# Patient Record
Sex: Male | Born: 1935 | Race: White | Hispanic: No | Marital: Married | Smoking: Never smoker
Health system: Southern US, Community
[De-identification: ages and names within clinical notes are randomized; demographics above are authoritative.]

## PROBLEM LIST (undated history)

## (undated) DIAGNOSIS — I480 Paroxysmal atrial fibrillation: Secondary | ICD-10-CM

## (undated) DIAGNOSIS — K219 Gastro-esophageal reflux disease without esophagitis: Secondary | ICD-10-CM

## (undated) DIAGNOSIS — E119 Type 2 diabetes mellitus without complications: Secondary | ICD-10-CM

## (undated) DIAGNOSIS — Z9989 Dependence on other enabling machines and devices: Secondary | ICD-10-CM

## (undated) DIAGNOSIS — Z8719 Personal history of other diseases of the digestive system: Secondary | ICD-10-CM

## (undated) DIAGNOSIS — H3552 Pigmentary retinal dystrophy: Secondary | ICD-10-CM

## (undated) DIAGNOSIS — R4189 Other symptoms and signs involving cognitive functions and awareness: Secondary | ICD-10-CM

## (undated) DIAGNOSIS — G4733 Obstructive sleep apnea (adult) (pediatric): Secondary | ICD-10-CM

## (undated) DIAGNOSIS — Z9981 Dependence on supplemental oxygen: Secondary | ICD-10-CM

## (undated) DIAGNOSIS — F0390 Unspecified dementia without behavioral disturbance: Secondary | ICD-10-CM

## (undated) DIAGNOSIS — J189 Pneumonia, unspecified organism: Secondary | ICD-10-CM

## (undated) HISTORY — DX: Paroxysmal atrial fibrillation: I48.0

## (undated) HISTORY — DX: Pigmentary retinal dystrophy: H35.52

## (undated) HISTORY — DX: Gastro-esophageal reflux disease without esophagitis: K21.9

## (undated) HISTORY — PX: FRACTURE SURGERY: SHX138

## (undated) HISTORY — PX: CATARACT EXTRACTION: SUR2

---

## 1999-10-17 ENCOUNTER — Encounter: Payer: Self-pay | Admitting: *Deleted

## 1999-10-17 ENCOUNTER — Encounter: Admission: RE | Admit: 1999-10-17 | Discharge: 1999-10-17 | Payer: Self-pay | Admitting: *Deleted

## 2005-03-05 ENCOUNTER — Ambulatory Visit (HOSPITAL_COMMUNITY): Admission: RE | Admit: 2005-03-05 | Discharge: 2005-03-05 | Payer: Self-pay | Admitting: Gastroenterology

## 2009-10-11 HISTORY — PX: ORIF RADIAL HEAD / NECK FRACTURE: SUR956

## 2009-10-28 ENCOUNTER — Emergency Department (HOSPITAL_COMMUNITY): Admission: EM | Admit: 2009-10-28 | Discharge: 2009-10-28 | Payer: Self-pay | Admitting: Emergency Medicine

## 2009-10-31 ENCOUNTER — Inpatient Hospital Stay (HOSPITAL_COMMUNITY): Admission: RE | Admit: 2009-10-31 | Discharge: 2009-11-06 | Payer: Self-pay | Admitting: Orthopedic Surgery

## 2009-11-03 ENCOUNTER — Ambulatory Visit: Payer: Self-pay | Admitting: Physical Medicine & Rehabilitation

## 2010-02-09 HISTORY — PX: TRANSURETHRAL RESECTION OF PROSTATE: SHX73

## 2010-02-19 ENCOUNTER — Ambulatory Visit (HOSPITAL_BASED_OUTPATIENT_CLINIC_OR_DEPARTMENT_OTHER): Admission: RE | Admit: 2010-02-19 | Discharge: 2010-02-20 | Payer: Self-pay | Admitting: Urology

## 2011-02-11 LAB — CBC
HCT: 39.2 % (ref 39.0–52.0)
Hemoglobin: 13.3 g/dL (ref 13.0–17.0)
MCHC: 34 g/dL (ref 30.0–36.0)
MCV: 91.4 fL (ref 78.0–100.0)
RDW: 12.5 % (ref 11.5–15.5)

## 2011-09-19 ENCOUNTER — Other Ambulatory Visit: Payer: Self-pay | Admitting: Dermatology

## 2012-09-17 ENCOUNTER — Other Ambulatory Visit: Payer: Self-pay | Admitting: Dermatology

## 2013-09-22 ENCOUNTER — Other Ambulatory Visit: Payer: Self-pay | Admitting: Dermatology

## 2014-08-16 ENCOUNTER — Encounter: Payer: Self-pay | Admitting: *Deleted

## 2014-08-16 ENCOUNTER — Encounter: Payer: Medicare Other | Attending: Internal Medicine | Admitting: *Deleted

## 2014-08-16 DIAGNOSIS — E119 Type 2 diabetes mellitus without complications: Secondary | ICD-10-CM | POA: Diagnosis not present

## 2014-08-16 DIAGNOSIS — Z713 Dietary counseling and surveillance: Secondary | ICD-10-CM | POA: Diagnosis not present

## 2014-08-16 NOTE — Patient Instructions (Signed)
Plan:  Aim for 3 Carb Choices per meal (45 grams) +/- 1 either way  Aim for 0-15 Carbs per snack if hungry  Include protein in moderation with your meals and snacks Consider reading food labels for Total Carbohydrate and Fat Grams of foods Consider  increasing your activity level by walking for 30 minutes daily as tolerated Consider taking medication  as directed by MD  Consider [employer] or I Can't believe it's not butter vs regular butter Consider Terrill MohrSara Lee or Ashby DawesNature Own reduced calorie bread (white or wheat) Dannon Light & Fit Greek Yogurt Water Flavors   Always have protein along with carbohydrates

## 2014-08-17 NOTE — Progress Notes (Signed)
Diabetes Self-Management Education  Visit Type:  DSME  Appt. Start Time: 1400 Appt. End Time: 1530  08/17/2014  [name], identified by name and date of birth, is a 80 y.o. male with a diagnosis of Diabetes: Type 2.  Other people present during visit:  Spouse/SO   ASSESSMENT  Initial Visit Information:  Are you currently following a meal plan?: No   Are you taking your medications as prescribed?: Yes Are you checking your feet?: No (goes to salon for pedicure)   How often do you need to have someone help you when you read instructions, pamphlets, or other written materials from your doctor or pharmacy?: 5 - Always (blind)    Psychosocial:   Patient Belief/Attitude about Diabetes: Motivated to manage diabetes Self-care barriers: Impaired vision Self-management support: Doctor's office;CDE visits;Family Other persons present: Spouse/SO Patient Concerns: Weight Control;Nutrition/Meal planning Special Needs: Instruct caregiver Preferred Learning Style: Auditory;Hands on Learning Readiness: Change in progress  Complications:   Last HgB A1C per patient/outside source: 7.9 mg/dL How often do you check your blood sugar?: 1-2 times/day (FBS daily) Fasting Blood glucose range (mg/dL): [phone] Have you had a dilated eye exam in the past 12 months?:  (blind) Have you had a dental exam in the past 12 months?: Yes  Diet Intake:  Breakfast: 2 boiled egg whites, bowl cheerios 1 1/2 C , banana, Stevia, whole wheat toast Lunch: [place] sandwich, cheese, mayonaise sandwich wheat break, vegetable soup  tuna, mayo on bread Dinner: pork barbeque, bun Beverage(s): regular CheerWine, sweet Tea (Stevia), coffee,   Exercise:  Exercise: ADL's  Individualized Plan for Diabetes Self-Management Training:   Learning Objective:  Patient will have a greater understanding of diabetes self-management.  Patient education plan per assessed needs and concerns is to attend individual sessions  for Diabetes Self Management Education     Education Topics Reviewed with Patient Today:    Role of diet in the treatment of diabetes and the relationship between the three main macronutrients and blood glucose level;Food label reading, portion sizes and measuring food.;Carbohydrate counting;Information on hints to eating out and maintain blood glucose control.;Meal options for control of blood glucose level and chronic complications. Role of exercise on diabetes management, blood pressure control and cardiac health.   Yearly dilated eye exam;Daily foot exams;Identified appropriate SMBG and/or A1C goals.   Relationship between chronic complications and blood glucose control;Dental care;Assessed and discussed foot care and prevention of foot problems;Identified and discussed with patient  current chronic complications;Reviewed with patient heart disease, higher risk of, and prevention     Helped patient develop diabetes management plan for (enter comment)  PATIENTS GOALS/Plan (Developed by the patient):  Nutrition: Follow meal plan discussed Physical Activity: Exercise 5-7 days per week Medications: take my medication as prescribed Monitoring : Other (comment) (FBS daily) Reducing Risk: do foot checks daily  Plan:   Patient Instructions  Plan:  Aim for 3 Carb Choices per meal (45 grams) +/- 1 either way  Aim for 0-15 Carbs per snack if hungry  Include protein in moderation with your meals and snacks Consider reading food labels for Total Carbohydrate and Fat Grams of foods Consider  increasing your activity level by walking for 30 minutes daily as tolerated Consider taking medication  as directed by MD  Consider [employer]sey or I Can't believe it's not butter vs regular butter Consider Terrill Mohr or Ashby Dawes Own reduced calorie bread (white or wheat) Dannon Light & Fit Greek Yogurt Water Flavors   Always have  protein along with carbohydrates       Expected Outcomes:   Demonstrated interest in learning. Expect positive outcomes  Education material provided: Living Well with Diabetes, A1C conversion sheet, Meal plan card and Snack sheet  If problems or questions, patient to contact team via:  Phone  Future DSME appointment: 4-6 wks

## 2014-10-04 ENCOUNTER — Ambulatory Visit: Payer: PRIVATE HEALTH INSURANCE | Admitting: *Deleted

## 2014-11-24 ENCOUNTER — Ambulatory Visit
Admission: RE | Admit: 2014-11-24 | Discharge: 2014-11-24 | Disposition: A | Discharge status: Home / Self Care | Payer: Medicare Other | Source: Ambulatory Visit | Attending: Internal Medicine | Admitting: Internal Medicine

## 2014-11-24 ENCOUNTER — Other Ambulatory Visit: Payer: Self-pay | Admitting: Internal Medicine

## 2014-11-24 DIAGNOSIS — G4734 Idiopathic sleep related nonobstructive alveolar hypoventilation: Secondary | ICD-10-CM

## 2014-12-22 ENCOUNTER — Encounter: Payer: Self-pay | Admitting: *Deleted

## 2014-12-23 ENCOUNTER — Encounter: Payer: Self-pay | Admitting: Neurology

## 2014-12-23 ENCOUNTER — Ambulatory Visit (INDEPENDENT_AMBULATORY_CARE_PROVIDER_SITE_OTHER): Payer: Medicare Other | Admitting: Neurology

## 2014-12-23 VITALS — BP 114/73 | HR 54 | Resp 18 | Ht 68.0 in | Wt 211.0 lb

## 2014-12-23 DIAGNOSIS — Z9981 Dependence on supplemental oxygen: Secondary | ICD-10-CM

## 2014-12-23 DIAGNOSIS — R0902 Hypoxemia: Secondary | ICD-10-CM | POA: Insufficient documentation

## 2014-12-23 DIAGNOSIS — R0683 Snoring: Secondary | ICD-10-CM | POA: Diagnosis not present

## 2014-12-23 DIAGNOSIS — H3552 Pigmentary retinal dystrophy: Secondary | ICD-10-CM | POA: Insufficient documentation

## 2014-12-23 DIAGNOSIS — I480 Paroxysmal atrial fibrillation: Secondary | ICD-10-CM

## 2014-12-23 NOTE — Progress Notes (Signed)
[facility]   Provider:  Melvyn Novas, M D  Referring Provider: Yates Decamp, MD   Primary Care Physician:  Lillia Mountain, MD  Chief Complaint  Patient presents with  . NP Ganji    Rm 10, wife    HPI:  Richard Lawson is a 81 y.o. male seen here as a referral  from Dr. Yates Decamp , cardiologist.  Richard Lawson is a new patient to my practice, referred here today by his cardiologist. His primary care physician is Dr. Kirby Funk. He is here for a concern of nocturnal hypoxemia. Richard Lawson wife is giving me a detailed history she explains that last May Richard Lawson begun taking metformin for the control of hyperglycemia. After tolerating the entry dose very well metformin dose was doubled. This occurred in September 2015. A visits to his primary care physician led to the discovery of atrial fibrillation 10 days after the dose was doubled. He was the same day referred to Dr. Jacinto Halim  now his cardiologist. Dr. Jacinto Halim found the atrial fibrillation to be paroxysmal occurring and spells. And also explained that the patient is as a yearly risk of stroke of 3.2%. He followed the patient is a nocturnal pulse oximetry on 09-26-14, which revealed the oxygen level to be less than 88% for 275 minutes of the recording. The lowest SPO2 was 71%. This was a markedly abnormal test and the patient was supplied with an oxygen concentrator. Dr. Reece Agar. ordered as pulse oximetry test after the patient had started oxygen supplementation. I do not have the results of the second finger Test available here but Dr.G wrote that the patient would benefit from further sleep evaluation. This is the reason why he is coming here today he also has a mild tricuspid regurgitation and mild mitral valve regurgitation. An echo was performed on 10-13-14 his ejection fraction was 56%. Dr. Reece Agar described a left anterior fascicular block and a poor R-wave progression indicative of  possible pulmonary disease underlying.   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  GERD with Barrett's esophagus, the patient had undergone multiple balloon stretching's with Dr. Melchor Amour, shoulder surgery. Richard Lawson explained that her husband was not promptly released after hisshoulder surgery from the hospital,  due to hypoxemia and apparently apneic events in the wake up room.   Sleep habits:  PM is bedtime,  He listened to TV or Radio for several hours , falls asleep and wakes up- his wife described a very fragmented sleep pattern, as a blind person is very tuned to audio stimuli, He wakes up at 8 AM , he gets 12 hours of bedtime, unsure how much sleep. His wife rises at 6 AM,  He drinks one cup of coffee, now decaffeinated. He naps on and off. He goes to gymnasium daily and walks a mile.   He has no known family history of  Sleep disordered breathing.    Review of Systems: Out of a complete 14 system review, the patient complains of only the following symptoms, and all other reviewed systems are negative. Complete loss of eye -sight at age 74 .   Epworth score 15 , Fatigue severity score ???  , depression score???   History   Social History  . Marital Status: Married    Spouse Name: N/A  . Number of Children: 1  . Years of Education: coll  Occupational History  . retired    Social History Main Topics  . Smoking status: Never Smoker   . Smokeless tobacco: Not on file  . Alcohol Use: No  . Drug Use: No  . Sexual Activity: Not on file   Other Topics Concern  . Not on file   Social History Narrative   Caffeine 2 glasses daily avg.    Family History  Problem Relation Age of Onset  . Lung cancer Father   . COPD Brother     Past Medical History  Diagnosis Date  . Diabetes mellitus without complication   . Retinitis pigmentosa of both eyes   . GERD (gastroesophageal reflux disease)   . Paroxysmal a-fib      Past Surgical History  Procedure Laterality Date  . Shoulder surgery Right 2010    Current Outpatient Prescriptions  Medication Sig Dispense Refill  . edoxaban (SAVAYSA) 60 MG TABS tablet Take 60 mg by mouth daily.    . metoprolol succinate (TOPROL-XL) 25 MG 24 hr tablet Take 25 mg by mouth daily.    Marland Kitchen. omeprazole (PRILOSEC) 20 MG capsule Take 20 mg by mouth daily.     No current facility-administered medications for this visit.    Allergies as of 12/23/2014  . (No Known Allergies)    Vitals: BP 114/73 mmHg  Pulse 54  Resp 18  Ht 5\' 8"  (1.727 m)  Wt 211 lb (95.709 kg)  BMI 32.09 kg/m2 Last Weight:  Wt Readings from Last 1 Encounters:  12/23/14 211 lb (95.709 kg)       Last Height:   Ht Readings from Last 1 Encounters:  12/23/14 5\' 8"  (1.727 m)    Physical exam:  General: The patient is awake, alert and appears not in acute distress. The patient is well groomed. Head: Normocephalic, atraumatic. Neck is supple. Mallampati 4  neck circumference:  16 inches . Nasal airflow unrestricted  TMJ is not evident . Retrognathia is not *seen.  Cardiovascular:  irregular rate and rhythm, without murmurs or carotid bruit, and without distended neck veins. Respiratory: Lungs are clear to auscultation. Skin:  Without evidence of edema, or rash Trunk: BMI is elevated and patient  has normal posture.  Neurologic exam : The patient is awake and alert, oriented to place and time.   Memory subjective  described as intact. There is a normal attention span & concentration ability. Speech is fluent without  dysarthria, dysphonia or aphasia. Mood and affect are appropriate.  Cranial nerves: Pupils are equal and briskly reactive to light. Retinitis pigmentosa -   Hearing to finger rub intact.  Facial sensation intact to fine touch.  Facial motor strength is symmetric and tongue and uvula move midline.  Motor exam:  Normal tone, muscle bulk and symmetric strength in all  extremities.  Sensory:  Fine touch, pinprick and vibration were tested in all extremities.  Proprioception is  normal.  Coordination: Rapid alternating movements in the fingers/hands is normal. Finger-to-nose maneuver  normal without evidence of ataxia, dysmetria or tremor.  Gait and station: Patient walks without assistive device and is able unassisted to climb up to the exam table. Strength within normal limits. Stance is stable and normal.  Tandem gait is unfragmented. Romberg testing is negative.  Deep tendon reflexes: in the  upper and lower extremities are symmetric and intact. Babinski maneuver response is downgoing.   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  Tachy brady rhythms had been noticed in the post surgical wake up room.  He will need special attention in the sleep lab, as he is blind. I explained the nature of OSA, and the sleep test in detail.  I would like for his wife to stay if she can, for his comfort - not for his need. She reports him to snore when on his back.    The patient was advised of the nature of the diagnosed sleep disorder, the treatment options and risks for general a health and wellness arising from not treating the condition. Visit duration was 40 minutes. More than 50% of this visit were spent on face to face explanation and information, risk factor explanation for OSA and atrial fibrillation.   Plan:  Treatment plan and additional workup :  SPLIT night polysomnography, please be gentle and explain every step that affects or touches him. Communicate in person, not through the loudspeaker. He is very audio sensitive.      Porfirio Mylar Penny Frisbie MD  12/23/2014

## 2014-12-23 NOTE — Patient Instructions (Signed)
Hypoxemia Hypoxemia occurs when your blood does not contain enough oxygen. The body cannot work well when it does not have enough oxygen because every part of your body needs oxygen. Oxygen travels to all parts of the body through your blood. Hypoxemia can develop suddenly or can come on slowly. CAUSES Some common causes of hypoxemia include:  Long-term (chronic) lung diseases, such as chronic obstructive pulmonary disease (COPD) or interstitial lung disease.  Disorders that affect breathing at night, such as sleep apnea.  Fluid buildup in your lungs (pulmonary edema).  Lung infection (pneumonia).  Lung or throat cancer.  Abnormal blood flow that bypasses the lungs (shunt).  Certain diseasesthat affect nerves or muscles.  A collapsed lung (pneumothorax).  A blood clot in the lungs (pulmonary embolus).  Certain types of heart disease.  Slow or shallow breathing (hypoventilation).  Certain medicines.  High altitudes.  Toxic chemicals and gases. SIGNS AND SYMPTOMS Not everyone who has hypoxemia will develop symptoms. If the hypoxemia developed quickly, you will likely have symptoms such as shortness of breath. If the hypoxemia came on slowly over months or years, you may not notice any symptoms. Symptoms can include:  Shortness of breath (dyspnea).  Bluish color of the skin, lips, or nail beds.  Breathing that is fast, noisy, or shallow.  A fast heartbeat.  Feeling tired or sleepy.  Being confused or feeling anxious. DIAGNOSIS To determine if you have hypoxemia, your health care provider may perform:  A physical exam.  Blood tests.  A pulse oximetry. A sensor will be put on your finger, toe, or earlobe to measure the percent of oxygen in your blood. TREATMENT You will likely be treated with oxygen therapy. Depending on the cause of your hypoxemia, you may need oxygen for a short time (weeks or months), or you may need it indefinitely. Your health care provider  may also recommend other therapies to treat the underlying cause of your hypoxemia. HOME CARE INSTRUCTIONS  Only take over-the-counter or prescription medicines as directed by your health care provider.  Follow oxygen safety measures if you are on oxygen therapy. These may include:  Always having a backup supply of oxygen.  Not allowing anyone to smoke around oxygen.  Handling the oxygen tanks carefully and as instructed.  If you smoke, quit. Stay away from people who smoke.  Follow up with your health care provider as directed. SEEK MEDICAL CARE IF:  You have any concerns about your oxygen therapy.  You still have trouble breathing.  You become short of breath when you exercise.  You are tired when you wake up.  You have a headache when you wake up. SEEK IMMEDIATE MEDICAL CARE IF:   Your breathing gets worse.  You have new shortness of breath with normal activity.  You have a bluish color of the skin, lips, or nail beds.  You have confusion or cloudy thinking.  You cough up dark mucus.  You have chest pain.  You have a fever. MAKE SURE YOU:  Understand these instructions.  Will watch your condition.  Will get help right away if you are not doing well or get worse. Document Released: 05/13/2011 Document Revised: 11/02/2013 Document Reviewed: 05/27/2013 Exit[employer] Patient Information 2015 [employer]. This information is not intended to replace advice given to you by your health care provider. Make sure you discuss any questions you have with your health care provider.  

## 2014-12-26 ENCOUNTER — Institutional Professional Consult (permissible substitution): Payer: Self-pay | Admitting: Neurology

## 2014-12-30 ENCOUNTER — Other Ambulatory Visit: Payer: Self-pay | Admitting: Dermatology

## 2015-01-10 DIAGNOSIS — J189 Pneumonia, unspecified organism: Secondary | ICD-10-CM

## 2015-01-10 HISTORY — DX: Pneumonia, unspecified organism: J18.9

## 2015-01-17 ENCOUNTER — Other Ambulatory Visit: Payer: Self-pay | Admitting: Dermatology

## 2015-01-20 ENCOUNTER — Inpatient Hospital Stay (HOSPITAL_BASED_OUTPATIENT_CLINIC_OR_DEPARTMENT_OTHER)
Admission: EM | Admit: 2015-01-20 | Discharge: 2015-01-24 | DRG: 871 | Disposition: A | Discharge status: Home / Self Care | Payer: Medicare Other | Attending: Internal Medicine | Admitting: Internal Medicine

## 2015-01-20 ENCOUNTER — Emergency Department (HOSPITAL_BASED_OUTPATIENT_CLINIC_OR_DEPARTMENT_OTHER): Payer: Medicare Other

## 2015-01-20 ENCOUNTER — Other Ambulatory Visit (HOSPITAL_BASED_OUTPATIENT_CLINIC_OR_DEPARTMENT_OTHER): Payer: Self-pay

## 2015-01-20 ENCOUNTER — Encounter (HOSPITAL_BASED_OUTPATIENT_CLINIC_OR_DEPARTMENT_OTHER): Payer: Self-pay | Admitting: *Deleted

## 2015-01-20 DIAGNOSIS — I4510 Unspecified right bundle-branch block: Secondary | ICD-10-CM | POA: Diagnosis present

## 2015-01-20 DIAGNOSIS — R739 Hyperglycemia, unspecified: Secondary | ICD-10-CM | POA: Diagnosis present

## 2015-01-20 DIAGNOSIS — Z9981 Dependence on supplemental oxygen: Secondary | ICD-10-CM | POA: Diagnosis not present

## 2015-01-20 DIAGNOSIS — R0602 Shortness of breath: Secondary | ICD-10-CM

## 2015-01-20 DIAGNOSIS — H540 Blindness, both eyes: Secondary | ICD-10-CM | POA: Diagnosis present

## 2015-01-20 DIAGNOSIS — R509 Fever, unspecified: Secondary | ICD-10-CM | POA: Diagnosis not present

## 2015-01-20 DIAGNOSIS — I480 Paroxysmal atrial fibrillation: Secondary | ICD-10-CM | POA: Diagnosis present

## 2015-01-20 DIAGNOSIS — E871 Hypo-osmolality and hyponatremia: Secondary | ICD-10-CM | POA: Diagnosis present

## 2015-01-20 DIAGNOSIS — J189 Pneumonia, unspecified organism: Secondary | ICD-10-CM | POA: Diagnosis present

## 2015-01-20 DIAGNOSIS — E119 Type 2 diabetes mellitus without complications: Secondary | ICD-10-CM | POA: Diagnosis present

## 2015-01-20 DIAGNOSIS — A419 Sepsis, unspecified organism: Principal | ICD-10-CM | POA: Diagnosis present

## 2015-01-20 DIAGNOSIS — H3552 Pigmentary retinal dystrophy: Secondary | ICD-10-CM | POA: Diagnosis present

## 2015-01-20 DIAGNOSIS — K219 Gastro-esophageal reflux disease without esophagitis: Secondary | ICD-10-CM | POA: Diagnosis present

## 2015-01-20 DIAGNOSIS — R05 Cough: Secondary | ICD-10-CM

## 2015-01-20 DIAGNOSIS — E876 Hypokalemia: Secondary | ICD-10-CM | POA: Diagnosis present

## 2015-01-20 DIAGNOSIS — R059 Cough, unspecified: Secondary | ICD-10-CM

## 2015-01-20 LAB — URINE MICROSCOPIC-ADD ON

## 2015-01-20 LAB — BASIC METABOLIC PANEL
Anion gap: 8 (ref 5–15)
BUN: 14 mg/dL (ref 6–23)
CO2: 27 mmol/L (ref 19–32)
CREATININE: 0.84 mg/dL (ref 0.50–1.35)
Calcium: 8.8 mg/dL (ref 8.4–10.5)
Chloride: 98 mmol/L (ref 96–112)
GFR calc Af Amer: >90 mL/min (ref >90)
GFR, EST NON AFRICAN AMERICAN: 81 mL/min — AB (ref >90)
GLUCOSE: 211 mg/dL — AB (ref 70–99)
POTASSIUM: 3.8 mmol/L (ref 3.5–5.1)
Sodium: 133 mmol/L — ABNORMAL LOW (ref 135–145)

## 2015-01-20 LAB — URINALYSIS, ROUTINE W REFLEX MICROSCOPIC
Bilirubin Urine: NEGATIVE
GLUCOSE, UA: 250 mg/dL — AB
Hgb urine dipstick: NEGATIVE
Ketones, ur: NEGATIVE mg/dL
Nitrite: NEGATIVE
PROTEIN: 30 mg/dL — AB
SPECIFIC GRAVITY, URINE: 1.022 (ref 1.005–1.030)
Urobilinogen, UA: 1 mg/dL (ref 0.0–1.0)
pH: 5 (ref 5.0–8.0)

## 2015-01-20 LAB — CBC WITH DIFFERENTIAL/PLATELET
BASOS ABS: 0.1 10*3/uL (ref 0.0–0.1)
Basophils Relative: 1 % (ref 0–1)
Eosinophils Absolute: 0.2 10*3/uL (ref 0.0–0.7)
Eosinophils Relative: 1 % (ref 0–5)
HCT: 40.1 % (ref 39.0–52.0)
Hemoglobin: 13.2 g/dL (ref 13.0–17.0)
LYMPHS PCT: 13 % (ref 12–46)
Lymphs Abs: 1.8 10*3/uL (ref 0.7–4.0)
MCH: 29.9 pg (ref 26.0–34.0)
MCHC: 32.9 g/dL (ref 30.0–36.0)
MCV: 90.9 fL (ref 78.0–100.0)
Monocytes Absolute: 2.6 10*3/uL — ABNORMAL HIGH (ref 0.1–1.0)
Monocytes Relative: 18 % — ABNORMAL HIGH (ref 3–12)
NEUTROS ABS: 10 10*3/uL — AB (ref 1.7–7.7)
Neutrophils Relative %: 67 % (ref 43–77)
PLATELETS: 314 10*3/uL (ref 150–400)
RBC: 4.41 MIL/uL (ref 4.22–5.81)
RDW: 12.8 % (ref 11.5–15.5)
WBC: 14.6 10*3/uL — ABNORMAL HIGH (ref 4.0–10.5)

## 2015-01-20 MED ORDER — DEXTROSE 5 % IV SOLN
1.0000 g | Freq: Once | INTRAVENOUS | Status: AC
  Administered 2015-01-20: 1 g via INTRAVENOUS

## 2015-01-20 MED ORDER — INSULIN ASPART 100 UNIT/ML ~~LOC~~ SOLN
0.0000 [IU] | SUBCUTANEOUS | Status: DC
  Administered 2015-01-21: 2 [IU] via SUBCUTANEOUS
  Administered 2015-01-21: 1 [IU] via SUBCUTANEOUS
  Administered 2015-01-21 – 2015-01-22 (×5): 2 [IU] via SUBCUTANEOUS
  Administered 2015-01-22: 1 [IU] via SUBCUTANEOUS
  Administered 2015-01-22 (×2): 2 [IU] via SUBCUTANEOUS
  Administered 2015-01-22: 1 [IU] via SUBCUTANEOUS
  Administered 2015-01-23 (×4): 2 [IU] via SUBCUTANEOUS

## 2015-01-20 MED ORDER — CEFTRIAXONE SODIUM 1 G IJ SOLR
INTRAMUSCULAR | Status: AC
  Filled 2015-01-20: qty 10

## 2015-01-20 MED ORDER — DEXTROSE 5 % IV SOLN
500.0000 mg | Freq: Once | INTRAVENOUS | Status: AC
  Administered 2015-01-20: 500 mg via INTRAVENOUS
  Filled 2015-01-20: qty 500

## 2015-01-20 NOTE — ED Notes (Signed)
Productive cough of yellow sputum and nose for one week. Pt states just not feeling well for one week.

## 2015-01-20 NOTE — Progress Notes (Signed)
Called from [facility] to admit patient with 8 days productive cough, fever 100.5, SOB. CXR negative. [facility] presumed CAP.

## 2015-01-20 NOTE — H&P (Signed)
PCP: Lillia MountainGRIFFIN,JOHN JOSEPH, MD  [facility] [place]  Chief Complaint:  Fever   HPI: Richard Lawson is a 81 y.o. male   has a past medical history of Diabetes mellitus without complication; Retinitis pigmentosa of both eyes; GERD (gastroesophageal reflux disease); and Paroxysmal a-fib.   Presented with  1 week history of productive cough or DC yellow sputum and feeling badly. Wife states he has been waking up short of breath at night. Today he had not had a fever at home. Family brought him [facility] center [facility]. Chest x-ray was unremarkable but patient was noted to be febrile up to 100.5 meeting sepsis criteria with heart rate of 101 respirations 23 white blood cell count 14.6. Patient was accepted to [facility]. I'll in emergency department he was started on ceftriaxone and azithromycin. Patient has history of atrial fibrillation for which she is on edoxaban. Has history of diabetes apparently diet-controlled. Wife noted worsening confusion.  Patient in the process of being work up for sleep apnea.  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  Hospitalist was called for admission for SIRS vs sepsis for presumed pulmonary infection possibly lagging chest x-ray  Review of Systems:    Pertinent positives include: Fevers, chills, fatigue  Constitutional:  No weight loss, night sweats,  weight loss  HEENT:  No headaches, Difficulty swallowing,Tooth/dental problems,Sore throat,  No sneezing, itching, ear ache, nasal congestion, post nasal drip,  Cardio-vascular:  No chest pain, Orthopnea, PND, anasarca, dizziness, palpitations.no Bilateral lower extremity swelling  GI:  No heartburn, indigestion, abdominal pain, nausea, vomiting, diarrhea, change in bowel habits, loss of appetite, melena, blood in stool, hematemesis Resp:  no shortness of breath at rest. No dyspnea on exertion, No excess mucus, no productive cough, No non-productive cough, No  coughing up of blood.No change in color of mucus.No wheezing. Skin:  no rash or lesions. No jaundice GU:  no dysuria, change in color of urine, no urgency or frequency. No straining to urinate.  No flank pain.  Musculoskeletal:  No joint pain or no joint swelling. No decreased range of motion. No back pain.  Psych:  No change in mood or affect. No depression or anxiety. No memory loss.  Neuro: no localizing neurological complaints, no tingling, no weakness, no double vision, no gait abnormality, no slurred speech, no confusion  Otherwise ROS are negative except for above, 10 systems were reviewed  Past Medical History: Past Medical History  Diagnosis Date  . Diabetes mellitus without complication   . Retinitis pigmentosa of both eyes   . GERD (gastroesophageal reflux disease)   . Paroxysmal a-fib    Past Surgical History  Procedure Laterality Date  . Shoulder surgery Right 2010     Medications: Prior to Admission medications   Medication Sig Start Date End Date Taking? Authorizing Provider  edoxaban (SAVAYSA) 60 MG TABS tablet Take 60 mg by mouth daily.   Yes Historical Provider, MD  metoprolol tartrate (LOPRESSOR) 25 MG tablet Take 25 mg by mouth 2 (two) times daily.  01/17/15  Yes Historical Provider, MD  omeprazole (PRILOSEC) 20 MG capsule Take 20 mg by mouth daily.   Yes Historical Provider, MD    Allergies:  No Known Allergies  Social History:  Ambulatory   independently  Or cane,   Lives at home With family     reports that he has never smoked. He does not have any smokeless tobacco history on file. He reports that  he does not drink alcohol or use illicit drugs.    Family History: family history includes COPD in his brother; Lung cancer in his father.    Physical Exam: Patient Vitals for the past 24 hrs:  BP Temp Temp src Pulse Resp SpO2 Weight  01/20/15 2002 (!) 144/79 mmHg 99.5 F (37.5 C) Oral (!) 101 20 97 % -  01/20/15 1908 142/74 mmHg - - 95 23 96 %  -  01/20/15 1901 - - - 98 20 100 % -  01/20/15 1710 135/66 mmHg 98.1 F (36.7 C) Oral 94 20 96 % -  01/20/15 1458 139/77 mmHg 98.7 F (37.1 C) Oral 93 22 97 % -  01/20/15 1343 - 100.5 F (38.1 C) Rectal - - - -  01/20/15 1342 - 98.3 F (36.8 C) Oral 92 20 95 % -  01/20/15 1125 153/73 mmHg 99.8 F (37.7 C) Oral 91 22 93 % 91.173 kg (201 lb)    1. General:  in No Acute distress 2. Psychological: Alert and  Oriented 3. Head/ENT:    Dry Mucous Membranes                          Head Non traumatic, neck supple                          Normal  Dentition 4. SKIN:   decreased Skin turgor,  Skin clean Dry and intact no rash 5. Heart: Regular rate and rhythm no Murmur, Rub or gallop 6. Lungs:   no wheezes occasional crackles   7. Abdomen: Soft, non-tender, Non distended 8. Lower extremities: no clubbing, cyanosis, or edema 9. Neurologically Grossly intact, moving all 4 extremities equally 10. MSK: Normal range of motion  body mass index is 30.57 kg/(m^2).   Labs on Admission:   Results for orders placed or performed during the hospital encounter of 01/20/15 (from the past 24 hour(s))  CBC with Differential     Status: Abnormal   Collection Time: 01/20/15 11:46 AM  Result Value Ref Range   WBC 14.6 (H) 4.0 - 10.5 K/uL   RBC 4.41 4.22 - 5.81 MIL/uL   Hemoglobin 13.2 13.0 - 17.0 g/dL   HCT 96.0 45.4 - 09.8 %   MCV 90.9 78.0 - 100.0 fL   MCH 29.9 26.0 - 34.0 pg   MCHC 32.9 30.0 - 36.0 g/dL   RDW 11.9 14.7 - 82.9 %   Platelets 314 150 - 400 K/uL   Neutrophils Relative % 67 43 - 77 %   Neutro Abs 10.0 (H) 1.7 - 7.7 K/uL   Lymphocytes Relative 13 12 - 46 %   Lymphs Abs 1.8 0.7 - 4.0 K/uL   Monocytes Relative 18 (H) 3 - 12 %   Monocytes Absolute 2.6 (H) 0.1 - 1.0 K/uL   Eosinophils Relative 1 0 - 5 %   Eosinophils Absolute 0.2 0.0 - 0.7 K/uL   Basophils Relative 1 0 - 1 %   Basophils Absolute 0.1 0.0 - 0.1 K/uL  Basic metabolic panel     Status: Abnormal   Collection Time:  01/20/15 11:46 AM  Result Value Ref Range   Sodium 133 (L) 135 - 145 mmol/L   Potassium 3.8 3.5 - 5.1 mmol/L   Chloride 98 96 - 112 mmol/L   CO2 27 19 - 32 mmol/L   Glucose, Bld 211 (H) 70 - 99 mg/dL   BUN 14  6 - 23 mg/dL   Creatinine, Ser 1.61 0.50 - 1.35 mg/dL   Calcium 8.8 8.4 - 09.6 mg/dL   GFR calc non Af Amer 81 (L) >90 mL/min   GFR calc Af Amer >90 >90 mL/min   Anion gap 8 5 - 15  Urinalysis, Routine w reflex microscopic     Status: Abnormal   Collection Time: 01/20/15  5:00 PM  Result Value Ref Range   Color, Urine YELLOW YELLOW   APPearance CLEAR CLEAR   Specific Gravity, Urine 1.022 1.005 - 1.030   pH 5.0 5.0 - 8.0   Glucose, UA 250 (A) NEGATIVE mg/dL   Hgb urine dipstick NEGATIVE NEGATIVE   Bilirubin Urine NEGATIVE NEGATIVE   Ketones, ur NEGATIVE NEGATIVE mg/dL   Protein, ur 30 (A) NEGATIVE mg/dL   Urobilinogen, UA 1.0 0.0 - 1.0 mg/dL   Nitrite NEGATIVE NEGATIVE   Leukocytes, UA SMALL (A) NEGATIVE  Urine microscopic-add on     Status: None   Collection Time: 01/20/15  5:00 PM  Result Value Ref Range   Squamous Epithelial / LPF RARE RARE   WBC, UA 3-6 <3 WBC/hpf   RBC / HPF 0-2 <3 RBC/hpf   Bacteria, UA RARE RARE   Urine-Other MUCOUS PRESENT     UA not obtained  No results found for: HGBA1C  Estimated Creatinine Clearance: 76.9 mL/min (by C-G formula based on Cr of 0.84).  BNP (last 3 results) No results for input(s): PROBNP in the last 8760 hours.  Other results:  I have pearsonaly reviewed this: ECG REPORT  Rate:91  Rhythm: Sinus rhythm with incomplete RBBB and short PR interval ST&T Change: No ischemic changes   Filed Weights   01/20/15 1125  Weight: 91.173 kg (201 lb)     Cultures: No results found for: SDES, SPECREQUEST, CULT, REPTSTATUS   Radiological Exams on Admission: Dg Chest 2 View  01/20/2015   CLINICAL DATA:  Cough  EXAM: CHEST  2 VIEW  COMPARISON:  11/24/2014  FINDINGS: Cardiac shadow is stable. Elevation of the right  hemidiaphragm is again seen. The previously seen atelectasis has improved on the left. Some mild right basilar atelectasis is noted. No other focal abnormality is seen.  IMPRESSION: Mild right basilar atelectasis.   Electronically Signed   By: Alcide Clever M.D.   On: 01/20/2015 13:09    Chart has been reviewed  Assessment/Plan  81 yo M here with cough and fever meeting sepsis criteria,CXR equivocal but clinically worrisome for CAP vs viral illness   Present on Admission:  . CAP (community acquired pneumonia)  - will admit for treatment of CAP will start on appropriate antibiotic coverage.   Obtain sputum cultures, blood cultures if febrile or if decompensates.  Provide oxygen as needed.  . Paroxysmal atrial fibrillation - continue anticoagulation with edoxaban and metoprolol  . Sepsis - blood cultures  Pending, check lactic acid, IVF resuscitation, IV antibiotics . DM II - diet controlled ordered SSI . Hyponatremia - mild, will obtain urine electrolytes and give IVF, check orthostatics   Prophylaxis:  edoxaban, Protonix  CODE STATUS:  FULL CODE    Other plan as per orders.  I have spent a total of 55 min on this admission  Jenavi Beedle 01/20/2015, 11:21 PM  [facility]  Pager [phone]   after 2 AM please page floor coverage PA If 7AM-7PM, please contact the day team taking care of the patient  [email or web address]  Password [email or web address]

## 2015-01-20 NOTE — ED Provider Notes (Signed)
CSN: [id number]     Arrival date & time 01/20/15  1108 History   First MD Initiated Contact with Patient 01/20/15 1244     Chief Complaint  Patient presents with  . Cough     (Consider location/radiation/quality/duration/timing/severity/associated sxs/prior Treatment) HPI Comments: Patient with a history of DM, A fib, GERD, and Hypoxemia presents today with a productive cough.  Cough has been present for the past 8 days and is gradually worsening.  He has been taking Mucinex for his symptoms, but does not feel that it is helping.  He is chronically on 3 L Oxygen at home.  He currently lives at home with his wife.  His wife reports that he woke up 2-3 times during the night last night due to difficulty breathing.  He has not taken his temperature prior to arrival.  He denies chest pain, SOB at this time, nausea, vomiting, abdominal pain, sore throat, headache, or urinary symptoms.  Patient is a 81 y.o. male presenting with cough.  Cough   Past Medical History  Diagnosis Date  . Diabetes mellitus without complication   . Retinitis pigmentosa of both eyes   . GERD (gastroesophageal reflux disease)   . Paroxysmal a-fib    Past Surgical History  Procedure Laterality Date  . Shoulder surgery Right 2010   Family History  Problem Relation Age of Onset  . Lung cancer Father   . COPD Brother    History  Substance Use Topics  . Smoking status: Never Smoker   . Smokeless tobacco: Not on file  . Alcohol Use: No    Review of Systems  Respiratory: Positive for cough.   All other systems reviewed and are negative.     Allergies  Review of patient's allergies indicates no known allergies.  Home Medications   Prior to Admission medications   Medication Sig Start Date End Date Taking? Authorizing Provider  edoxaban (SAVAYSA) 60 MG TABS tablet Take 60 mg by mouth daily.    Historical Provider, MD  metoprolol succinate (TOPROL-XL) 25 MG 24 hr tablet Take 25 mg by mouth daily.     Historical Provider, MD  omeprazole (PRILOSEC) 20 MG capsule Take 20 mg by mouth daily.    Historical Provider, MD   BP 153/73 mmHg  Pulse 92  Temp(Src) 100.5 F (38.1 C) (Rectal)  Resp 20  Wt 201 lb (91.173 kg)  SpO2 95% Physical Exam  Constitutional: He appears well-developed and well-nourished. No distress.  HENT:  Head: Normocephalic and atraumatic.  Neck: Normal range of motion. Neck supple.  Cardiovascular: Normal rate, regular rhythm and normal heart sounds.   Pulmonary/Chest: Effort normal. No respiratory distress. He has no wheezes. He has rales.  Abdominal: Soft. Bowel sounds are normal. He exhibits no distension and no mass. There is no tenderness. There is no rebound and no guarding.  Musculoskeletal: Normal range of motion.  No LE  edema or erythema bilaterally  Neurological: He is alert.  Skin: Skin is warm and dry. He is not diaphoretic.  Psychiatric: He has a normal mood and affect.  Nursing note and vitals reviewed.   ED Course  Procedures (including critical care time) Labs Review Labs Reviewed  CBC WITH DIFFERENTIAL/PLATELET - Abnormal; Notable for the following:    WBC 14.6 (*)    Neutro Abs 10.0 (*)    Monocytes Relative 18 (*)    Monocytes Absolute 2.6 (*)    All other components within normal limits  BASIC METABOLIC PANEL - Abnormal; Notable for  the following:    Sodium 133 (*)    Glucose, Bld 211 (*)    GFR calc non Af Amer 81 (*)    All other components within normal limits    Imaging Review Dg Chest 2 View  01/20/2015   CLINICAL DATA:  Cough  EXAM: CHEST  2 VIEW  COMPARISON:  11/24/2014  FINDINGS: Cardiac shadow is stable. Elevation of the right hemidiaphragm is again seen. The previously seen atelectasis has improved on the left. Some mild right basilar atelectasis is noted. No other focal abnormality is seen.  IMPRESSION: Mild right basilar atelectasis.   Electronically Signed   By: Alcide CleverMark  Lukens M.D.   On: 01/20/2015 13:09     EKG  Interpretation   Date/Time:  Friday January 20 2015 14:57:37 EST Ventricular Rate:  91 PR Interval:  100 QRS Duration: 102 QT Interval:  364 QTC Calculation: 447 R Axis:   -54 Text Interpretation:  Sinus rhythm with short PR Incomplete right bundle  branch block Left anterior fascicular block Nonspecific ST abnormality  Abnormal ECG Confirmed by RAY MD, Duwayne HeckANIELLE ([id number]) on 01/20/2015 3:48:11 PM      MDM   Final diagnoses:  None   Patient presents today with a chief complaint of productive cough x 8 days, which is gradually worsening.  He is chronically on 3 L [place] Oxygen.  Patient with leukocytosis and found to have a temp of 100.5 F rectally in the ED.  Lungs also with crackles.  CXR showing atelectasis, but otherwise negative.  However, patient with a clinical picture consistent with Pneumonia.  He lives at home and has not had any hospitalizations in the past 90 days.  Therefore, patient treated for CAP.  Patient admitted to Hospitalist service for additional treatment and management      Santiago GladHeather Yamato Kopf, PA-C 01/21/15 2250  Margarita Grizzleanielle Ray, MD 01/26/15 [phone]

## 2015-01-21 ENCOUNTER — Inpatient Hospital Stay (HOSPITAL_COMMUNITY): Payer: Medicare Other

## 2015-01-21 DIAGNOSIS — E871 Hypo-osmolality and hyponatremia: Secondary | ICD-10-CM

## 2015-01-21 DIAGNOSIS — R739 Hyperglycemia, unspecified: Secondary | ICD-10-CM

## 2015-01-21 LAB — CBC WITH DIFFERENTIAL/PLATELET
Basophils Absolute: 0.1 10*3/uL (ref 0.0–0.1)
Basophils Relative: 0 % (ref 0–1)
EOS ABS: 0.2 10*3/uL (ref 0.0–0.7)
EOS PCT: 1 % (ref 0–5)
HCT: 37 % — ABNORMAL LOW (ref 39.0–52.0)
HEMOGLOBIN: 12.3 g/dL — AB (ref 13.0–17.0)
Lymphocytes Relative: 13 % (ref 12–46)
Lymphs Abs: 1.9 10*3/uL (ref 0.7–4.0)
MCH: 29.9 pg (ref 26.0–34.0)
MCHC: 33.2 g/dL (ref 30.0–36.0)
MCV: 90 fL (ref 78.0–100.0)
MONOS PCT: 15 % — AB (ref 3–12)
Monocytes Absolute: 2.1 10*3/uL — ABNORMAL HIGH (ref 0.1–1.0)
NEUTROS PCT: 71 % (ref 43–77)
Neutro Abs: 10.2 10*3/uL — ABNORMAL HIGH (ref 1.7–7.7)
Platelets: 315 10*3/uL (ref 150–400)
RBC: 4.11 MIL/uL — ABNORMAL LOW (ref 4.22–5.81)
RDW: 12.8 % (ref 11.5–15.5)
WBC: 14.5 10*3/uL — AB (ref 4.0–10.5)

## 2015-01-21 LAB — GLUCOSE, CAPILLARY
GLUCOSE-CAPILLARY: 140 mg/dL — AB (ref 70–99)
GLUCOSE-CAPILLARY: 154 mg/dL — AB (ref 70–99)
GLUCOSE-CAPILLARY: 164 mg/dL — AB (ref 70–99)
Glucose-Capillary: 175 mg/dL — ABNORMAL HIGH (ref 70–99)
Glucose-Capillary: 169 mg/dL — ABNORMAL HIGH (ref 70–99)
Glucose-Capillary: 199 mg/dL — ABNORMAL HIGH (ref 70–99)

## 2015-01-21 LAB — INFLUENZA PANEL BY PCR (TYPE A & B)
H1N1 flu by pcr: NOT DETECTED
INFLAPCR: NEGATIVE
Influenza B By PCR: NEGATIVE

## 2015-01-21 LAB — COMPREHENSIVE METABOLIC PANEL
ALBUMIN: 3.3 g/dL — AB (ref 3.5–5.2)
ALT: 23 U/L (ref 0–53)
AST: 19 U/L (ref 0–37)
Alkaline Phosphatase: 68 U/L (ref 39–117)
Anion gap: 10 (ref 5–15)
BILIRUBIN TOTAL: 0.4 mg/dL (ref 0.3–1.2)
BUN: 10 mg/dL (ref 6–23)
CALCIUM: 8.4 mg/dL (ref 8.4–10.5)
CHLORIDE: 98 mmol/L (ref 96–112)
CO2: 26 mmol/L (ref 19–32)
Creatinine, Ser: 0.67 mg/dL (ref 0.50–1.35)
GFR calc non Af Amer: 88 mL/min — ABNORMAL LOW (ref >90)
Glucose, Bld: 172 mg/dL — ABNORMAL HIGH (ref 70–99)
POTASSIUM: 3.7 mmol/L (ref 3.5–5.1)
Sodium: 134 mmol/L — ABNORMAL LOW (ref 135–145)
Total Protein: 6.5 g/dL (ref 6.0–8.3)

## 2015-01-21 LAB — SODIUM, URINE, RANDOM: Sodium, Ur: 100 mmol/L

## 2015-01-21 LAB — STREP PNEUMONIAE URINARY ANTIGEN: STREP PNEUMO URINARY ANTIGEN: NEGATIVE

## 2015-01-21 LAB — OSMOLALITY, URINE: OSMOLALITY UR: 506 mosm/kg (ref 390–1090)

## 2015-01-21 LAB — CREATININE, URINE, RANDOM: Creatinine, Urine: 90.81 mg/dL

## 2015-01-21 LAB — LACTIC ACID, PLASMA: LACTIC ACID, VENOUS: 1.4 mmol/L (ref 0.5–2.0)

## 2015-01-21 MED ORDER — EDOXABAN TOSYLATE 30 MG PO TABS
60.0000 mg | ORAL_TABLET | Freq: Every day | ORAL | Status: DC
  Administered 2015-01-21 – 2015-01-24 (×4): 60 mg via ORAL
  Filled 2015-01-21 (×4): qty 2

## 2015-01-21 MED ORDER — CEFTRIAXONE SODIUM IN DEXTROSE 20 MG/ML IV SOLN
1.0000 g | INTRAVENOUS | Status: DC
  Administered 2015-01-21 – 2015-01-23 (×3): 1 g via INTRAVENOUS
  Filled 2015-01-21 (×4): qty 50

## 2015-01-21 MED ORDER — METOPROLOL TARTRATE 25 MG PO TABS
25.0000 mg | ORAL_TABLET | Freq: Two times a day (BID) | ORAL | Status: DC
  Administered 2015-01-21 – 2015-01-24 (×8): 25 mg via ORAL
  Filled 2015-01-21 (×9): qty 1

## 2015-01-21 MED ORDER — PANTOPRAZOLE SODIUM 40 MG PO TBEC
40.0000 mg | DELAYED_RELEASE_TABLET | Freq: Every day | ORAL | Status: DC
  Administered 2015-01-21 – 2015-01-24 (×4): 40 mg via ORAL
  Filled 2015-01-21 (×5): qty 1

## 2015-01-21 MED ORDER — DEXTROSE 5 % IV SOLN
500.0000 mg | INTRAVENOUS | Status: DC
  Administered 2015-01-21 – 2015-01-22 (×2): 500 mg via INTRAVENOUS
  Filled 2015-01-21 (×3): qty 500

## 2015-01-21 MED ORDER — SODIUM CHLORIDE 0.9 % IV SOLN
INTRAVENOUS | Status: AC
  Administered 2015-01-21: 1000 mL via INTRAVENOUS

## 2015-01-21 NOTE — Progress Notes (Signed)
[facility]                                                                              Patient Demographics  Richard Lawson, is a 81 y.o. male, DOB - 07-May-1932, ZOX:[id number]  Admit date - 01/20/2015   Admitting Physician Therisa Doyne, MD  Outpatient Primary MD for the patient is Lillia Mountain, MD  LOS - 1   Chief Complaint  Patient presents with  . Cough      HPI on 01/20/2015 by Dr. Therisa Doyne Presented with 1 week history of productive cough or DC yellow sputum and feeling badly. Wife states he has been waking up short of breath at night. Today he had not had a fever at home. Family brought him into that center [facility]. Chest x-ray was unremarkable but patient was noted to be febrile up to 100.5 meeting sepsis criteria with heart rate of 101 respirations 23 white blood cell count 14.6. Patient was accepted to transfer to [facility]. I'll in emergency department he was started on ceftriaxone and azithromycin. Patient has history of atrial fibrillation for which she is on edoxaban. Has history of diabetes apparently diet-controlled. Wife noted worsening confusion. Patient in the process of being work up for sleep apnea. 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  Assessment & Plan   Sepsis secondary to ?community acquired pneumonia vs URI -Upon admission, patient had leukocytosis, was febrile with tachycardia and tachypnea -Patient currently afebrile, tachycardia and tachypnea had resolved -Lactic acid 1.4 -CXR on 01/20/2015: Mild right basilar atelectasis -CXR on 01/21/2015: Persistently low lung volumes, with atelectasis and/or scarring at the lung bases, no evidence of lobar pneumonia -Blood cultures show no growth to date -UA shows 363 BC, small leukocytes, patient currently asymptomatic -Urine culture pending -Urine strep pneumonia and legionella antigens, Influenza PCR pending -Empirically started on azithromycin and  ceftriaxone  Paroxysmal Atrial fibrillation -Continue Savaysa -Currently rate and rhythm controlled -Continue metoprolol  Diabetes mellitus, type 2 -Continue ISS and CBG monitoring -Hemoglobin A1c pending -Patient mainly diet controlled as he is on no home medications  Mild Hyponatremia -Possibly secondary to insensible losses -Na 134 -Continue to monitor BMP -TSH pending  Blindness secondary to retinitis pigmentosa  GERD -Continue PPI  Code Status: Full  Family Communication: Wife at bedside  Disposition Plan: Admitted, pending improvement and workup  Time Spent in minutes   30 minutes  Procedures  None  Consults   None  DVT Prophylaxis  Savaysa  Lab Results  Component Value Date   PLT 315 01/21/2015    Medications  Scheduled Meds: . azithromycin  500 mg Intravenous Q24H  . cefTRIAXone (ROCEPHIN)  IV  1 g Intravenous Q24H  . edoxaban  60 mg Oral Daily  . insulin aspart  0-9 Units Subcutaneous 6 times per day  . metoprolol tartrate  25 mg Oral BID  . pantoprazole  40 mg Oral Daily   Continuous Infusions:  PRN Meds:.  Antibiotics     Anti-infectives    Start     Dose/Rate Route Frequency Ordered Stop   01/21/15 1800  azithromycin (ZITHROMAX) 500 mg in dextrose 5 % 250 mL IVPB  500 mg 250 mL/hr over 60 Minutes Intravenous Every 24 hours 01/21/15 0011     01/21/15 1700  cefTRIAXone (ROCEPHIN) 1 g in dextrose 5 % 50 mL IVPB - Premix     1 g 100 mL/hr over 30 Minutes Intravenous Every 24 hours 01/21/15 0011     01/20/15 1624  cefTRIAXone (ROCEPHIN) 1 G injection    Comments:  Doug SouMasten, Theresa   : cabinet override      01/20/15 1624 01/20/15 1739   01/20/15 1615  cefTRIAXone (ROCEPHIN) 1 g in dextrose 5 % 50 mL IVPB     1 g 100 mL/hr over 30 Minutes Intravenous  Once 01/20/15 1601 01/20/15 1736   01/20/15 1615  azithromycin (ZITHROMAX) 500 mg in dextrose 5 % 250 mL IVPB     500 mg 250 mL/hr over 60 Minutes Intravenous  Once 01/20/15 1601 01/20/15  1857        Subjective:   Richard Lawson seen and examined today.  Patient states his breathing is about the same.  He continues to cough and not feel well.  He denies chest pain, abdominal pain, dizziness, or headache.   Objective:   Filed Vitals:   01/21/15 0306 01/21/15 0400 01/21/15 0500 01/21/15 0554  BP: 97/53   114/49  Pulse: 107   81  Temp: 98.6 F (37 C)   98.5 F (36.9 C)  TempSrc: Oral   Oral  Resp: 18 25 24 16   Height:    5\' 8"  (1.727 m)  Weight:    91.627 kg (202 lb)  SpO2: 91%   93%    Wt Readings from Last 3 Encounters:  01/21/15 91.627 kg (202 lb)  12/23/14 95.709 kg (211 lb)     Intake/Output Summary (Last 24 hours) at 01/21/15 1045 Last data filed at 01/21/15 0930  Gross per 24 hour  Intake    955 ml  Output    650 ml  Net    305 ml    Exam  General: Well developed, well nourished  HEENT: NCAT, mucous membranes moist.   Cardiovascular: Normal S1/S2, RRR  Respiratory: Coarse/rhonchorous breath sounds.  Abdomen: Soft, nontender, nondistended, + bowel sounds  Extremities: warm dry without cyanosis clubbing or edema  Neuro: AAOx3, blind due to RP, otherwise nonfocal  Psych: Appropriate mood and affect  Data Review   Micro Results Recent Results (from the past 240 hour(s))  Blood culture (routine x 2)     Status: None (Preliminary result)   Collection Time: 01/20/15  4:50 PM  Result Value Ref Range Status   Specimen Description BLOOD RIGHT HAND  Final   Special Requests BOTTLES DRAWN AEROBIC AND ANAEROBIC 5CC EACH  Final   Culture   Final           BLOOD CULTURE RECEIVED NO GROWTH TO DATE CULTURE WILL BE HELD FOR 5 DAYS BEFORE ISSUING A FINAL NEGATIVE REPORT Performed at [facility]    Report Status PENDING  Incomplete  Blood culture (routine x 2)     Status: None (Preliminary result)   Collection Time: 01/20/15  4:55 PM  Result Value Ref Range Status   Specimen Description BLOOD LEFT ARM  Final   Special Requests  BOTTLES DRAWN AEROBIC AND ANAEROBIC 10CC EACH  Final   Culture   Final           BLOOD CULTURE RECEIVED NO GROWTH TO DATE CULTURE WILL BE HELD FOR 5 DAYS BEFORE ISSUING A FINAL NEGATIVE REPORT Performed at [facility]    Report Status PENDING  Incomplete    Radiology Reports Dg Chest 2 View  01/21/2015   CLINICAL DATA:  81 year old male with a history of cough.  EXAM: CHEST - 2 VIEW  COMPARISON:  01/20/2015, 11/24/2014  FINDINGS: Cardiomediastinal silhouette unchanged in size and contour. No evidence of pulmonary vascular congestion.  Lung volumes remain low with persistent elevation of the right hemidiaphragm.  No evidence of pleural effusion. Linear opacities on the lateral view without confluent airspace disease. No pneumothorax.  Accentuated kyphotic curvature on the lateral view.  Osteopenia with no displaced fracture.  IMPRESSION: Persistently low lung volumes with atelectasis and/ scarring at the lung bases. No evidence of lobar pneumonia.  Signed,  Yvone Neu. Loreta Ave, DO  [facility]  [facility]   Electronically Signed   By: Gilmer Mor D.O.   On: 01/21/2015 09:25   Dg Chest 2 View  01/20/2015   CLINICAL DATA:  Cough  EXAM: CHEST  2 VIEW  COMPARISON:  11/24/2014  FINDINGS: Cardiac shadow is stable. Elevation of the right hemidiaphragm is again seen. The previously seen atelectasis has improved on the left. Some mild right basilar atelectasis is noted. No other focal abnormality is seen.  IMPRESSION: Mild right basilar atelectasis.   Electronically Signed   By: Alcide Clever M.D.   On: 01/20/2015 13:09    CBC  Recent Labs Lab 01/20/15 1146 01/21/15 0516  WBC 14.6* 14.5*  HGB 13.2 12.3*  HCT 40.1 37.0*  PLT 314 315  MCV 90.9 90.0  MCH 29.9 29.9  MCHC 32.9 33.2  RDW 12.8 12.8  LYMPHSABS 1.8 1.9  MONOABS 2.6* 2.1*  EOSABS 0.2 0.2  BASOSABS 0.1 0.1    Chemistries   Recent Labs Lab 01/20/15 1146 01/21/15 0516  NA 133*  134*  K 3.8 3.7  CL 98 98  CO2 27 26  GLUCOSE 211* 172*  BUN 14 10  CREATININE 0.84 0.67  CALCIUM 8.8 8.4  AST  --  19  ALT  --  23  ALKPHOS  --  68  BILITOT  --  0.4   ------------------------------------------------------------------------------------------------------------------ estimated creatinine clearance is 80.9 mL/min (by C-G formula based on Cr of 0.67). ------------------------------------------------------------------------------------------------------------------ No results for input(s): HGBA1C in the last 72 hours. ------------------------------------------------------------------------------------------------------------------ No results for input(s): CHOL, HDL, LDLCALC, TRIG, CHOLHDL, LDLDIRECT in the last 72 hours. ------------------------------------------------------------------------------------------------------------------ No results for input(s): TSH, T4TOTAL, T3FREE, THYROIDAB in the last 72 hours.  Invalid input(s): FREET3 ------------------------------------------------------------------------------------------------------------------ No results for input(s): VITAMINB12, FOLATE, FERRITIN, TIBC, IRON, RETICCTPCT in the last 72 hours.  Coagulation profile No results for input(s): INR, PROTIME in the last 168 hours.  No results for input(s): DDIMER in the last 72 hours.  Cardiac Enzymes No results for input(s): CKMB, TROPONINI, MYOGLOBIN in the last 168 hours.  Invalid input(s): CK ------------------------------------------------------------------------------------------------------------------ Invalid input(s): POCBNP    Quinterius Gaida D.O. on 01/21/2015 at 10:45 AM  Between 7am to 7pm - Pager - [phone]  After 7pm go to [email or web address] - password [email or web address]  And look for the night coverage person covering for me after hours  [facility] Office  [phone]

## 2015-01-22 LAB — GLUCOSE, CAPILLARY
GLUCOSE-CAPILLARY: 172 mg/dL — AB (ref 70–99)
Glucose-Capillary: 132 mg/dL — ABNORMAL HIGH (ref 70–99)
Glucose-Capillary: 141 mg/dL — ABNORMAL HIGH (ref 70–99)
Glucose-Capillary: 155 mg/dL — ABNORMAL HIGH (ref 70–99)
Glucose-Capillary: 175 mg/dL — ABNORMAL HIGH (ref 70–99)

## 2015-01-22 LAB — BASIC METABOLIC PANEL
ANION GAP: 8 (ref 5–15)
BUN: 11 mg/dL (ref 6–23)
CALCIUM: 8.1 mg/dL — AB (ref 8.4–10.5)
CO2: 27 mmol/L (ref 19–32)
CREATININE: 0.63 mg/dL (ref 0.50–1.35)
Chloride: 102 mmol/L (ref 96–112)
GFR calc non Af Amer: >90 mL/min (ref >90)
Glucose, Bld: 138 mg/dL — ABNORMAL HIGH (ref 70–99)
Potassium: 3.4 mmol/L — ABNORMAL LOW (ref 3.5–5.1)
SODIUM: 137 mmol/L (ref 135–145)

## 2015-01-22 LAB — CBC
HEMATOCRIT: 36.6 % — AB (ref 39.0–52.0)
Hemoglobin: 12.1 g/dL — ABNORMAL LOW (ref 13.0–17.0)
MCH: 29.5 pg (ref 26.0–34.0)
MCHC: 33.1 g/dL (ref 30.0–36.0)
MCV: 89.3 fL (ref 78.0–100.0)
Platelets: 339 10*3/uL (ref 150–400)
RBC: 4.1 MIL/uL — ABNORMAL LOW (ref 4.22–5.81)
RDW: 12.8 % (ref 11.5–15.5)
WBC: 13.1 10*3/uL — ABNORMAL HIGH (ref 4.0–10.5)

## 2015-01-22 MED ORDER — IPRATROPIUM-ALBUTEROL 0.5-2.5 (3) MG/3ML IN SOLN: 3.0000 mL | RESPIRATORY_TRACT | Status: DC | PRN

## 2015-01-22 MED ORDER — POTASSIUM CHLORIDE CRYS ER 20 MEQ PO TBCR
40.0000 meq | EXTENDED_RELEASE_TABLET | Freq: Once | ORAL | Status: AC
  Administered 2015-01-22: 40 meq via ORAL
  Filled 2015-01-22: qty 2

## 2015-01-22 NOTE — Progress Notes (Signed)
[facility]                                                                              Patient Demographics  Richard Lawson, is a 81 y.o. male, DOB - 11/15/1931, NIO:[id number]  Admit date - 01/20/2015   Admitting Physician Richard Doyne, MD  Outpatient Primary MD for the patient is Richard Mountain, MD  LOS - 2   Chief Complaint  Patient presents with  . Cough      HPI on 01/20/2015 by Dr. Therisa Lawson Presented with 1 week history of productive cough or DC yellow sputum and feeling badly. Wife states he has been waking up short of breath at night. Today he had not had a fever at home. Family brought him into that center [facility]. Chest x-ray was unremarkable but patient was noted to be febrile up to 100.5 meeting sepsis criteria with heart rate of 101 respirations 23 white blood cell count 14.6. Patient was accepted to transfer to [facility] long. I'll in emergency department he was started on ceftriaxone and azithromycin. Patient has history of atrial fibrillation for which she is on edoxaban. Has history of diabetes apparently diet-controlled. Wife noted worsening confusion. Patient in the process of being work up for sleep apnea. 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  Assessment & Plan   Sepsis secondary to ?community acquired pneumonia vs URI -Upon admission, patient had leukocytosis, was febrile with tachycardia and tachypnea -Patient currently afebrile, tachycardia and tachypnea had resolved -Lactic acid 1.4 -CXR on 01/20/2015: Mild right basilar atelectasis -CXR on 01/21/2015: Persistently low lung volumes, with atelectasis and/or scarring at the lung bases, no evidence of lobar pneumonia -Blood cultures show no growth to date -UA shows 363 BC, small leukocytes, patient currently asymptomatic -Urine culture 30K, Ecoli -Urine strep pneumonia antigen and Influenza PCR negative -Urine legionella antigen pending -Continue  azithromycin and ceftriaxone -Will repeat CXR  Paroxysmal Atrial fibrillation -Continue Savaysa -Currently rate and rhythm controlled -Continue metoprolol  Diabetes mellitus, type 2 -Continue ISS and CBG monitoring -Hemoglobin A1c pending -Patient mainly diet controlled as he is on no home medications  Mild Hyponatremia -Possibly secondary to insensible losses -resolved   Hypokalemia -Will replace and continue to monitor  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  Likely discharge 3/14  Time Spent in minutes   30 minutes  Procedures  None  Consults   None  DVT Prophylaxis  Savaysa  Lab Results  Component Value Date   PLT 339 01/22/2015    Medications  Scheduled Meds: . azithromycin  500 mg Intravenous Q24H  . cefTRIAXone (ROCEPHIN)  IV  1 g Intravenous Q24H  . edoxaban  60 mg Oral Daily  . insulin aspart  0-9 Units Subcutaneous 6 times per day  . metoprolol tartrate  25 mg Oral BID  . pantoprazole  40 mg Oral Daily  . potassium chloride  40 mEq Oral Once   Continuous Infusions:  PRN Meds:.  Antibiotics     Anti-infectives    Start     Dose/Rate Route  Frequency Ordered Stop   01/21/15 1800  azithromycin (ZITHROMAX) 500 mg in dextrose 5 % 250 mL IVPB     500 mg 250 mL/hr over 60 Minutes Intravenous Every 24 hours 01/21/15 0011     01/21/15 1700  cefTRIAXone (ROCEPHIN) 1 g in dextrose 5 % 50 mL IVPB - Premix     1 g 100 mL/hr over 30 Minutes Intravenous Every 24 hours 01/21/15 0011     01/20/15 1624  cefTRIAXone (ROCEPHIN) 1 G injection    Comments:  Doug Sou   : cabinet override      01/20/15 1624 01/20/15 1739   01/20/15 1615  cefTRIAXone (ROCEPHIN) 1 g in dextrose 5 % 50 mL IVPB     1 g 100 mL/hr over 30 Minutes Intravenous  Once 01/20/15 1601 01/20/15 1736   01/20/15 1615  azithromycin (ZITHROMAX) 500  mg in dextrose 5 % 250 mL IVPB     500 mg 250 mL/hr over 60 Minutes Intravenous  Once 01/20/15 1601 01/20/15 1857        Subjective:   [name] seen and examined today.  Patient states his breathing has improved slightly.  He continues to have cough throughout the day.  Denies chest pain, abdominal pain.   Objective:   Filed Vitals:   01/21/15 2047 01/21/15 2216 01/22/15 0417 01/22/15 1002  BP: 102/59 105/58 101/52 122/72  Pulse: 97 93 88 100  Temp: 98.4 F (36.9 C)  98.1 F (36.7 C)   TempSrc: Oral  Oral   Resp: 16  18   Height:      Weight:      SpO2: 93%  94%     Wt Readings from Last 3 Encounters:  01/21/15 91.627 kg (202 lb)  12/23/14 95.709 kg (211 lb)     Intake/Output Summary (Last 24 hours) at 01/22/15 1109 Last data filed at 01/22/15 0452  Gross per 24 hour  Intake    350 ml  Output    200 ml  Net    150 ml    Exam  General: Well developed, well nourished, NAD  HEENT: NCAT, mucous membranes moist.   Cardiovascular: Normal S1/S2, RRR  Respiratory: Breath sounds improved, better air movement, slightly diminshed  Abdomen: Soft, nontender, nondistended, + bowel sounds  Extremities: warm dry without cyanosis clubbing or edema  Psych: Pleasant and appropriate  Data Review   Micro Results Recent Results (from the past 240 hour(s))  Blood culture (routine x 2)     Status: None (Preliminary result)   Collection Time: 01/20/15  4:50 PM  Result Value Ref Range Status   Specimen Description BLOOD RIGHT HAND  Final   Special Requests BOTTLES DRAWN AEROBIC AND ANAEROBIC 5CC EACH  Final   Culture   Final           BLOOD CULTURE RECEIVED NO GROWTH TO DATE CULTURE WILL BE HELD FOR 5 DAYS BEFORE ISSUING A FINAL NEGATIVE REPORT Performed at [facility]    Report Status PENDING  Incomplete  Blood culture (routine x 2)     Status: None (Preliminary result)   Collection Time: 01/20/15  4:55 PM  Result Value Ref Range Status   Specimen  Description BLOOD LEFT ARM  Final   Special Requests BOTTLES DRAWN AEROBIC AND ANAEROBIC 10CC EACH  Final   Culture   Final           BLOOD CULTURE RECEIVED NO GROWTH TO DATE CULTURE WILL BE HELD FOR 5 DAYS BEFORE  ISSUING A FINAL NEGATIVE REPORT Performed at [facility]    Report Status PENDING  Incomplete  Culture, Urine     Status: None (Preliminary result)   Collection Time: 01/20/15  5:00 PM  Result Value Ref Range Status   Specimen Description URINE, CLEAN CATCH  Final   Special Requests NONE  Final   Colony Count   Final    30,000 COLONIES/ML Performed at [facility]    Culture   Final    ESCHERICHIA COLI Performed at [facility]    Report Status PENDING  Incomplete    Radiology Reports Dg Chest 2 View  01/21/2015   CLINICAL DATA:  81 year old male with a history of cough.  EXAM: CHEST - 2 VIEW  COMPARISON:  01/20/2015, 11/24/2014  FINDINGS: Cardiomediastinal silhouette unchanged in size and contour. No evidence of pulmonary vascular congestion.  Lung volumes remain low with persistent elevation of the right hemidiaphragm.  No evidence of pleural effusion. Linear opacities on the lateral view without confluent airspace disease. No pneumothorax.  Accentuated kyphotic curvature on the lateral view.  Osteopenia with no displaced fracture.  IMPRESSION: Persistently low lung volumes with atelectasis and/ scarring at the lung bases. No evidence of lobar pneumonia.  Signed,  Yvone Neu. Loreta Ave, DO  [facility]  [facility]   Electronically Signed   By: Gilmer Mor D.O.   On: 01/21/2015 09:25   Dg Chest 2 View  01/20/2015   CLINICAL DATA:  Cough  EXAM: CHEST  2 VIEW  COMPARISON:  11/24/2014  FINDINGS: Cardiac shadow is stable. Elevation of the right hemidiaphragm is again seen. The previously seen atelectasis has improved on the left. Some mild right basilar atelectasis is noted. No other focal abnormality is seen.   IMPRESSION: Mild right basilar atelectasis.   Electronically Signed   By: Alcide Clever M.D.   On: 01/20/2015 13:09    CBC  Recent Labs Lab 01/20/15 1146 01/21/15 0516 01/22/15 0554  WBC 14.6* 14.5* 13.1*  HGB 13.2 12.3* 12.1*  HCT 40.1 37.0* 36.6*  PLT 314 315 339  MCV 90.9 90.0 89.3  MCH 29.9 29.9 29.5  MCHC 32.9 33.2 33.1  RDW 12.8 12.8 12.8  LYMPHSABS 1.8 1.9  --   MONOABS 2.6* 2.1*  --   EOSABS 0.2 0.2  --   BASOSABS 0.1 0.1  --     Chemistries   Recent Labs Lab 01/20/15 1146 01/21/15 0516 01/22/15 0554  NA 133* 134* 137  K 3.8 3.7 3.4*  CL 98 98 102  CO2 GLUCOSE 211* 172* 138*  BUN CREATININE 0.84 0.67 0.63  CALCIUM 8.8 8.4 8.1*  AST  --  19  --   ALT  --  23  --   ALKPHOS  --  68  --   BILITOT  --  0.4  --    ------------------------------------------------------------------------------------------------------------------ estimated creatinine clearance is 80.9 mL/min (by C-G formula based on Cr of 0.63). ------------------------------------------------------------------------------------------------------------------ No results for input(s): HGBA1C in the last 72 hours. ------------------------------------------------------------------------------------------------------------------ No results for input(s): CHOL, HDL, LDLCALC, TRIG, CHOLHDL, LDLDIRECT in the last 72 hours. ------------------------------------------------------------------------------------------------------------------ No results for input(s): TSH, T4TOTAL, T3FREE, THYROIDAB in the last 72 hours.  Invalid input(s): FREET3 ------------------------------------------------------------------------------------------------------------------ No results for input(s): VITAMINB12, FOLATE, FERRITIN, TIBC, IRON, RETICCTPCT in the last 72 hours.  Coagulation profile No results for input(s): INR, PROTIME in the last 168 hours.  No results for input(s): DDIMER in  the last 72  hours.  Cardiac Enzymes No results for input(s): CKMB, TROPONINI, MYOGLOBIN in the last 168 hours.  Invalid input(s): CK ------------------------------------------------------------------------------------------------------------------ Invalid input(s): POCBNP    Nevaen Tredway D.O. on 01/22/2015 at 11:09 AM  Between 7am to 7pm - Pager - [phone]  After 7pm go to [email or web address] - password [email or web address]  And look for the night coverage person covering for me after hours  [facility] Office  [phone]

## 2015-01-23 ENCOUNTER — Inpatient Hospital Stay (HOSPITAL_COMMUNITY): Payer: Medicare Other

## 2015-01-23 LAB — CBC
HCT: 37.3 % — ABNORMAL LOW (ref 39.0–52.0)
Hemoglobin: 12.2 g/dL — ABNORMAL LOW (ref 13.0–17.0)
MCH: 29.3 pg (ref 26.0–34.0)
MCHC: 32.7 g/dL (ref 30.0–36.0)
MCV: 89.4 fL (ref 78.0–100.0)
PLATELETS: 382 10*3/uL (ref 150–400)
RBC: 4.17 MIL/uL — ABNORMAL LOW (ref 4.22–5.81)
RDW: 12.8 % (ref 11.5–15.5)
WBC: 11.3 10*3/uL — ABNORMAL HIGH (ref 4.0–10.5)

## 2015-01-23 LAB — BASIC METABOLIC PANEL
Anion gap: 9 (ref 5–15)
BUN: 8 mg/dL (ref 6–23)
CHLORIDE: 102 mmol/L (ref 96–112)
CO2: 27 mmol/L (ref 19–32)
Calcium: 8.6 mg/dL (ref 8.4–10.5)
Creatinine, Ser: 0.72 mg/dL (ref 0.50–1.35)
GFR calc Af Amer: >90 mL/min (ref >90)
GFR calc non Af Amer: 86 mL/min — ABNORMAL LOW (ref >90)
GLUCOSE: 178 mg/dL — AB (ref 70–99)
POTASSIUM: 3.8 mmol/L (ref 3.5–5.1)
Sodium: 138 mmol/L (ref 135–145)

## 2015-01-23 LAB — URINE CULTURE

## 2015-01-23 LAB — GLUCOSE, CAPILLARY
GLUCOSE-CAPILLARY: 176 mg/dL — AB (ref 70–99)
GLUCOSE-CAPILLARY: 184 mg/dL — AB (ref 70–99)
Glucose-Capillary: 191 mg/dL — ABNORMAL HIGH (ref 70–99)
Glucose-Capillary: 199 mg/dL — ABNORMAL HIGH (ref 70–99)

## 2015-01-23 LAB — LEGIONELLA ANTIGEN, URINE

## 2015-01-23 LAB — HEMOGLOBIN A1C
Hgb A1c MFr Bld: 7.1 % — ABNORMAL HIGH (ref 4.8–5.6)
MEAN PLASMA GLUCOSE: 157 mg/dL

## 2015-01-23 MED ORDER — INSULIN ASPART 100 UNIT/ML ~~LOC~~ SOLN
0.0000 [IU] | Freq: Three times a day (TID) | SUBCUTANEOUS | Status: DC
  Administered 2015-01-24: 2 [IU] via SUBCUTANEOUS

## 2015-01-23 MED ORDER — AZITHROMYCIN 500 MG PO TABS
500.0000 mg | ORAL_TABLET | Freq: Every day | ORAL | Status: DC
  Administered 2015-01-23: 500 mg via ORAL
  Filled 2015-01-23 (×2): qty 1

## 2015-01-23 NOTE — Discharge Summary (Signed)
Physician Discharge Summary  Richard Lawson ZOX:[id number] DOB: February 23, 1932 DOA: 01/20/2015  PCP: Lillia Mountain, MD  Admit date: 01/20/2015 Discharge date: 01/24/2015  Time spent: 45 minutes  Recommendations for Outpatient Follow-up:  Patient will be discharged home with home health PT and social worker. He should follow-up with his primary care physician on 02/06/2015.  Patient should also follow with his cardiologist as needed. Patient to continue his medication as prescribed. Patient tolerated carb modified heart healthy diet. Patient may resume activity as tolerated.  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 Therisa Doyne Presented with 1 week history of productive cough or DC yellow sputum and feeling badly. Wife states he has been waking up short of breath at night. Today he had not had a fever at home. Family brought him into that center [facility]. Chest x-ray was unremarkable but patient was noted to be febrile up to 100.5 meeting sepsis criteria with heart rate of 101 respirations 23 white blood cell count 14.6. Patient was accepted to transfer to [facility] long. I'll in emergency department he was started on ceftriaxone and azithromycin. Patient has history of atrial fibrillation for which she is on edoxaban. Has history of diabetes apparently diet-controlled. Wife noted worsening confusion. Patient in the process of being work up for sleep apnea. 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  Hospital Course:  Sepsis secondary to ?community  acquired pneumonia vs URI -Upon admission, patient had leukocytosis, was febrile with tachycardia and tachypnea -Patient currently afebrile, tachycardia and tachypnea had resolved -Leukocytosis improving -Lactic acid 1.4 -CXR on 01/20/2015: Mild right basilar atelectasis -CXR on 01/21/2015: Persistently low lung volumes, with atelectasis and/or scarring at the lung bases, no evidence of lobar pneumonia -Blood cultures show no growth to date -UA shows 363 BC, small leukocytes, patient currently asymptomatic -Urine culture 30K, Ecoli -Urine strep pneumonia and legionella antigens and Influenza PCR negative -Initially placed on azithromycin and ceftriaxone -repeat CXR 01/23/2015 chronic but mildly increased streaky opacity at both lung bases -Will discharge patient on Ceftin 500mg  BID for an additional 2 days  Paroxysmal Atrial fibrillation -Continue Savaysa -Currently rate and rhythm controlled -Continue metoprolol  Diabetes mellitus, type 2 -Placed on ISS and CBG monitoring during hospitalization -Hemoglobin A1c 7.1 -Patient mainly diet controlled as he is on no home medications  Mild Hyponatremia -Possibly secondary to insensible losses -resolved   Hypokalemia -Resolved  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   Cardiovascular: Normal S1/S2, RRR  Respiratory: Diminished, however clear  Psych: Pleasant and appropriate  Discharge Instructions      Discharge Instructions    Discharge instructions    Complete by:  As directed   Patient will be discharged home with home health PT and social worker. He should follow-up with his primary care physician on 02/06/2015.  Patient should also follow with his cardiologist as needed. Patient to continue his medication as prescribed. Patient tolerated carb modified heart healthy diet. Patient may resume activity as tolerated.            Medication List    TAKE these medications        cefUROXime 500 MG tablet  Commonly known as:  CEFTIN  Take 1 tablet (500 mg total) by mouth 2 (two) times daily with a meal.     edoxaban 60 MG Tabs tablet  Commonly known as:  SAVAYSA  Take 60 mg by mouth daily.     metoprolol tartrate 25 MG tablet  Commonly known as:  LOPRESSOR  Take 25 mg by mouth 2 (two) times daily.     omeprazole 20 MG capsule  Commonly known as:  PRILOSEC  Take 20 mg by mouth daily.       No Known Allergies Follow-up Information    Follow up with Primary care physician. Schedule an appointment as soon as possible for a visit in 1 week.   Why:  Hsopital follow-up       The results of significant diagnostics from this hospitalization (including imaging, microbiology, ancillary and laboratory) are listed below for reference.    Significant Diagnostic Studies: Dg Chest 2 View  01/23/2015   CLINICAL DATA:  81 year old male with productive cough, fever, feels ill, shortness of breath. Initial encounter.  EXAM: CHEST  2 VIEW  COMPARISON:  01/21/2015 and earlier.  FINDINGS: Stable lung volumes. Moderate chronic elevation of the right hemidiaphragm. Continued in mildly increased streaky 0 *SCRATCH* sec continued and mildly increased streaky opacity at both lung bases. No pneumothorax, pulmonary edema, pleural effusion, or consolidation. Visualized tracheal air column is within normal limits. Stable cardiac size and mediastinal contours. No acute osseous abnormality identified.  IMPRESSION: Chronic but mildly increased streaky opacity at both lung bases. Favor atelectasis but difficult to exclude lung base infection.   Electronically Signed   By: Odessa Fleming M.D.   On: 01/23/2015 08:17   Dg Chest 2  View  01/21/2015   CLINICAL DATA:  81 year old male with a history of cough.  EXAM: CHEST - 2 VIEW  COMPARISON:  01/20/2015, 11/24/2014  FINDINGS: Cardiomediastinal silhouette unchanged in size and contour. No evidence of pulmonary vascular congestion.  Lung volumes remain low with persistent elevation of the right hemidiaphragm.  No evidence of pleural effusion. Linear opacities on the lateral view without confluent airspace disease. No pneumothorax.  Accentuated kyphotic curvature on the lateral view.  Osteopenia with no displaced fracture.  IMPRESSION: Persistently low lung volumes with atelectasis and/ scarring at the lung bases. No evidence of lobar pneumonia.  Signed,  Yvone Neu. Loreta Ave, DO  [facility]  [facility]   Electronically Signed   By: Gilmer Mor D.O.   On: 01/21/2015 09:25   Dg Chest 2 View  01/20/2015   CLINICAL DATA:  Cough  EXAM: CHEST  2 VIEW  COMPARISON:  11/24/2014  FINDINGS: Cardiac shadow is stable. Elevation of the right hemidiaphragm is again seen. The previously seen atelectasis has improved on the left. Some mild right basilar atelectasis is noted. No other focal abnormality is seen.  IMPRESSION: Mild right basilar atelectasis.   Electronically Signed   By: Alcide Clever M.D.   On: 01/20/2015 13:09    Microbiology: Recent Results (from the past 240 hour(s))  Blood culture (routine x 2)     Status: None (Preliminary result)  Collection Time: 01/20/15  4:50 PM  Result Value Ref Range Status   Specimen Description BLOOD RIGHT HAND  Final   Special Requests BOTTLES DRAWN AEROBIC AND ANAEROBIC 5CC EACH  Final   Culture   Final           BLOOD CULTURE RECEIVED NO GROWTH TO DATE CULTURE WILL BE HELD FOR 5 DAYS BEFORE ISSUING A FINAL NEGATIVE REPORT Performed at [facility]    Report Status PENDING  Incomplete  Blood culture (routine x 2)     Status: None (Preliminary result)   Collection Time: 01/20/15  4:55 PM   Result Value Ref Range Status   Specimen Description BLOOD LEFT ARM  Final   Special Requests BOTTLES DRAWN AEROBIC AND ANAEROBIC 10CC EACH  Final   Culture   Final           BLOOD CULTURE RECEIVED NO GROWTH TO DATE CULTURE WILL BE HELD FOR 5 DAYS BEFORE ISSUING A FINAL NEGATIVE REPORT Performed at [facility]    Report Status PENDING  Incomplete  Culture, Urine     Status: None   Collection Time: 01/20/15  5:00 PM  Result Value Ref Range Status   Specimen Description URINE, CLEAN CATCH  Final   Special Requests NONE  Final   Colony Count   Final    30,000 COLONIES/ML Performed at [facility]    Culture   Final    ESCHERICHIA COLI Performed at [facility]    Report Status 01/23/2015 FINAL  Final   Organism ID, Bacteria ESCHERICHIA COLI  Final      Susceptibility   Escherichia coli - MIC*    AMPICILLIN <=2 SENSITIVE Sensitive     CEFAZOLIN <=4 SENSITIVE Sensitive     CEFTRIAXONE <=1 SENSITIVE Sensitive     CIPROFLOXACIN <=0.25 SENSITIVE Sensitive     GENTAMICIN <=1 SENSITIVE Sensitive     LEVOFLOXACIN <=0.12 SENSITIVE Sensitive     NITROFURANTOIN <=16 SENSITIVE Sensitive     TOBRAMYCIN <=1 SENSITIVE Sensitive     TRIMETH/SULFA <=20 SENSITIVE Sensitive     PIP/TAZO <=4 SENSITIVE Sensitive     * ESCHERICHIA COLI     Labs: Basic Metabolic Panel:  Recent Labs Lab 01/20/15 1146 01/21/15 0516 01/22/15 0554 01/23/15 0546  NA 133* 134* 137 138  K 3.8 3.7 3.4* 3.8  CL 98 98 102 102  CO2 GLUCOSE 211* 172* 138* 178*  BUN CREATININE 0.84 0.67 0.63 0.72  CALCIUM 8.8 8.4 8.1* 8.6   Liver Function Tests:  Recent Labs Lab 01/21/15 0516  AST 19  ALT 23  ALKPHOS 68  BILITOT 0.4  PROT 6.5  ALBUMIN 3.3*   No results for input(s): LIPASE, AMYLASE in the last 168 hours. No results for input(s): AMMONIA in the last 168 hours. CBC:  Recent Labs Lab 01/20/15 1146 01/21/15 0516 01/22/15 0554 01/23/15 0546  WBC  14.6* 14.5* 13.1* 11.3*  NEUTROABS 10.0* 10.2*  --   --   HGB 13.2 12.3* 12.1* 12.2*  HCT 40.1 37.0* 36.6* 37.3*  MCV 90.9 90.0 89.3 89.4  PLT 314 315 339 382   Cardiac Enzymes: No results for input(s): CKTOTAL, CKMB, CKMBINDEX, TROPONINI in the last 168 hours. BNP: BNP (last 3 results) No results for input(s): BNP in the last 8760 hours.  ProBNP (last 3 results) No results for input(s): PROBNP in the last 8760 hours.  CBG:  Recent Labs Lab 01/23/15  0847 01/23/15 1243 01/23/15 1705 01/23/15 2015 01/24/15 0713  GLUCAP 176* 191* 184* 199* 168*       Signed:  Edsel PetrinMIKHAIL, Tabetha Haraway  [facility] 01/24/2015, 11:26 AM

## 2015-01-23 NOTE — Discharge Instructions (Signed)

## 2015-01-23 NOTE — Progress Notes (Signed)
PHARMACIST - PHYSICIAN COMMUNICATION DR:   Mikhail CONCERNING: Antibiotic IV to Oral Route Change Policy  RECOMMENDATION: This patient is receiving azithromycin by the intravenous route.  Based on criteria approved by the Pharmacy and Therapeutics Committee, the antibiotic(s) is/are being converted to the equivalent oral dose form(s).   DESCRIPTION: These criteria include:  Patient being treated for a respiratory tract infection, urinary tract infection, cellulitis or clostridium difficile associated diarrhea if on metronidazole  The patient is not neutropenic and does not exhibit a GI malabsorption state  The patient is eating (either orally or via tube) and/or has been taking other orally administered medications for a least 24 hours  The patient is improving clinically and has a Tmax < 100.5  If you have questions about this conversion, please contact the Pharmacy Department  []  ( [phone] )  [place] []  ( [phone]    []  ( [phone]  [facility] [x]  ( [phone] )  [facility]   Nikky Duba, PharmD, BCPS   

## 2015-01-23 NOTE — Evaluation (Signed)
Physical Therapy Evaluation Patient Details Name: Richard Lawson MRN: [id number] DOB: 1931-12-15 Today's Date: 01/23/2015   History of Present Illness  81 yo male admitted with sepsis, Pna. Hx of DM, Afib, blindness.   Clinical Impression  On eval, pt required Min assist for mobility-able to ambulate ~150 feet while holding onto therapist's shoulder guidance and walking stick in opposite hand. Wife present during session. Recommend HHPT follow up at discharge.     Follow Up Recommendations Home health PT    Equipment Recommendations  None recommended by PT    Recommendations for Other Services       Precautions / Restrictions Precautions Precautions: Fall Precaution Comments: blind Restrictions Weight Bearing Restrictions: No      Mobility  Bed Mobility               General bed mobility comments: pt sitting EOB. Wife requested therapist practice bed mobiilty on window seat (rolling and sidelying<>sit) for ease and improved safety  Transfers Overall transfer level: Needs assistance   Transfers: Sit to/from Stand Sit to Stand: Supervision            Ambulation/Gait Ambulation/Gait assistance: Min assist Ambulation Distance (Feet): 150 Feet   Gait Pattern/deviations: Step-through pattern     General Gait Details: Min assist to guide in unfamiliar environment. Pt held onto therapist's shoulder with one hand and walking stick in opposite hand.   Stairs            Wheelchair Mobility    Modified Rankin (Stroke Patients Only)       Balance                                             Pertinent Vitals/Pain Pain Assessment: No/denies pain    Home Living Family/patient expects to be discharged to:: Private residence Living Arrangements: Spouse/significant other Available Help at Discharge: Family Type of Home: House Home Access: Stairs to enter Entrance Stairs-Rails: Doctor, general practiceight;Left Entrance Stairs-Number of Steps: 2 Home  Layout: Laundry or work area in basement;One level Home Equipment: Cane - single point      Prior Function Level of Independence: Independent with assistive device(s)         Comments: uses walking stick     Hand Dominance        Extremity/Trunk Assessment   Upper Extremity Assessment: Overall WFL for tasks assessed           Lower Extremity Assessment: Generalized weakness      Cervical / Trunk Assessment: Normal  Communication   Communication: No difficulties  Cognition Arousal/Alertness: Awake/alert Behavior During Therapy: WFL for tasks assessed/performed Overall Cognitive Status: Within Functional Limits for tasks assessed                      General Comments      Exercises        Assessment/Plan    PT Assessment Patient needs continued PT services  PT Diagnosis Difficulty walking;Generalized weakness   PT Problem List Decreased mobility;Decreased activity tolerance;Decreased strength  PT Treatment Interventions Gait training;Functional mobility training;Therapeutic activities;Therapeutic exercise;Patient/family education   PT Goals (Current goals can be found in the Care Plan section) Acute Rehab PT Goals Patient Stated Goal: home tomorrow.  PT Goal Formulation: With patient/family Time For Goal Achievement: 02/06/15 Potential to Achieve Goals: Good    Frequency Min 3X/week  Barriers to discharge        Co-evaluation               End of Session Equipment Utilized During Treatment: Gait belt Activity Tolerance: Patient tolerated treatment well Patient left: in chair;with call bell/phone within reach;with family/visitor present (in window seat to have lunch)           Time: 6045-4098 PT Time Calculation (min) (ACUTE ONLY): 21 min   Charges:   PT Evaluation $Initial PT Evaluation Tier I: 1 Procedure     PT G Codes:        Rebeca Alert, MPT Pager: [phone]

## 2015-01-23 NOTE — Care Management Note (Addendum)
    Page 1 of 1   01/24/2015     11:55:45 AM CARE MANAGEMENT NOTE 01/24/2015  Patient:  Richard Lawson,Richard Lawson   Account Number:  [id number]  Date Initiated:  01/23/2015  Documentation initiated by:  Lorenda IshiharaPEELE,Melyssa Signor  Subjective/Objective Assessment:   81 yo male admitted with PNA. PTA lived at home with spouse. Legally blind.     Action/Plan:   Home when stable   Anticipated DC Date:  01/26/2015   Anticipated DC Plan:  HOME W HOME HEALTH SERVICES      DC Planning Services  CM consult      Choice offered to / List presented to:  C-3 Spouse        HH arranged  HH-2 PT  HH-6 SOCIAL WORKER      [facility]   Status of service:  Completed, signed off Medicare Important Message given?  YES (If response is "NO", the following Medicare IM given date fields will be blank) Date Medicare IM given:  01/23/2015 Medicare IM given by:  [facility],Estreya Clay Date Additional Medicare IM given:   Additional Medicare IM given by:    Discharge Disposition:  HOME W HOME HEALTH SERVICES  Per UR Regulation:  Reviewed for med. necessity/level of care/duration of stay  If discussed at Long Length of Stay Meetings, dates discussed:    Comments:  01-24-15 Lorenda IshiharaSuzanne Tobie Hellen RN CM 1130 Long discussion with spouse and patient about d/c plans. Spouse is concerned about patient's mental status and her ability to care for patient especially at night. Discussed options for SNF vs HH. Agreeable to [facility], CSW added in case once home, they are unable to manage and require SNF. Provided spouse with list of private duty agencies for extra assistance at home. [facility] and wished to use them again for PT. [facility]. No DME needed.

## 2015-01-23 NOTE — Progress Notes (Signed)
[facility]                                                                              Patient Demographics  Richard Lawson, is a 81 y.o. male, DOB - 05-28-1932, NWG:[id number]  Admit date - 01/20/2015   Admitting Physician Therisa Doyne, MD  Outpatient Primary MD for the patient is Lillia Mountain, MD  LOS - 3   Chief Complaint  Patient presents with  . Cough      HPI on 01/20/2015 by Dr. Therisa Doyne Presented with 1 week history of productive cough or DC yellow sputum and feeling badly. Wife states he has been waking up short of breath at night. Today he had not had a fever at home. Family brought him into that center [facility]. Chest x-ray was unremarkable but patient was noted to be febrile up to 100.5 meeting sepsis criteria with heart rate of 101 respirations 23 white blood cell count 14.6. Patient was accepted to transfer to [facility] long. I'll in emergency department he was started on ceftriaxone and azithromycin. Patient has history of atrial fibrillation for which she is on edoxaban. Has history of diabetes apparently diet-controlled. Wife noted worsening confusion. Patient in the process of being work up for sleep apnea. 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  Assessment & Plan   Sepsis secondary to ?community acquired pneumonia vs URI -Upon admission, patient had leukocytosis, was febrile with tachycardia and tachypnea -Patient currently afebrile, tachycardia and tachypnea had resolved -Leukocytosis improving -Lactic acid 1.4 -CXR on 01/20/2015: Mild right basilar atelectasis -CXR on 01/21/2015: Persistently low lung volumes, with atelectasis and/or scarring at the lung bases, no evidence of lobar pneumonia -Blood cultures show no growth to date -UA shows 363 BC, small leukocytes, patient currently asymptomatic -Urine culture 30K, Ecoli -Urine strep pneumonia antigen and Influenza PCR negative -Urine legionella  antigen pending -Continue azithromycin and ceftriaxone -repeat CXR 01/23/2015 chronic but mildly increased streaky opacity at both lung bases  Paroxysmal Atrial fibrillation -Continue Savaysa -Currently rate and rhythm controlled -Continue metoprolol  Diabetes mellitus, type 2 -Continue ISS and CBG monitoring -Hemoglobin A1c 7.1 -Patient mainly diet controlled as he is on no home medications  Mild Hyponatremia -Possibly secondary to insensible losses -resolved   Hypokalemia -Resolved  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  Likely discharge 3/15  Time Spent in minutes   30 minutes  Procedures  None  Consults   None  DVT Prophylaxis  Savaysa  Lab Results  Component Value Date   PLT 382 01/23/2015    Medications  Scheduled Meds: . azithromycin  500 mg Oral q1800  . cefTRIAXone (ROCEPHIN)  IV  1 g Intravenous Q24H  . edoxaban  60 mg Oral Daily  . insulin aspart  0-9 Units Subcutaneous 6 times per day  . metoprolol tartrate  25 mg Oral BID  . pantoprazole  40 mg Oral Daily   Continuous Infusions:  PRN Meds:.  Antibiotics     Anti-infectives    Start  Dose/Rate Route Frequency Ordered Stop   01/23/15 1800  azithromycin (ZITHROMAX) tablet 500 mg     500 mg Oral Daily-1800 01/23/15 0756     01/21/15 1800  azithromycin (ZITHROMAX) 500 mg in dextrose 5 % 250 mL IVPB  Status:  Discontinued     500 mg 250 mL/hr over 60 Minutes Intravenous Every 24 hours 01/21/15 0011 01/23/15 0756   01/21/15 1700  cefTRIAXone (ROCEPHIN) 1 g in dextrose 5 % 50 mL IVPB - Premix     1 g 100 mL/hr over 30 Minutes Intravenous Every 24 hours 01/21/15 0011     01/20/15 1624  cefTRIAXone (ROCEPHIN) 1 G injection    Comments:  Doug Sou   : cabinet override      01/20/15 1624 01/20/15 1739    01/20/15 1615  cefTRIAXone (ROCEPHIN) 1 g in dextrose 5 % 50 mL IVPB     1 g 100 mL/hr over 30 Minutes Intravenous  Once 01/20/15 1601 01/20/15 1736   01/20/15 1615  azithromycin (ZITHROMAX) 500 mg in dextrose 5 % 250 mL IVPB     500 mg 250 mL/hr over 60 Minutes Intravenous  Once 01/20/15 1601 01/20/15 1857        Subjective:   [name] seen and examined today.  Patient feels his breathing has improved but he continues to have cough occasionally.  Denies chest pain, abdominal pain.   Objective:   Filed Vitals:   01/22/15 1002 01/22/15 1400 01/22/15 2237 01/23/15 0600  BP: 122/72 129/73 134/74 119/60  Pulse: 100 94 95 92  Temp:  98.4 F (36.9 C) 98.8 F (37.1 C) 98.4 F (36.9 C)  TempSrc:  Oral Oral Oral  Resp:  Height:      Weight:      SpO2:  100% 100% 92%    Wt Readings from Last 3 Encounters:  01/21/15 91.627 kg (202 lb)  12/23/14 95.709 kg (211 lb)     Intake/Output Summary (Last 24 hours) at 01/23/15 1236 Last data filed at 01/23/15 1000  Gross per 24 hour  Intake      0 ml  Output    700 ml  Net   -700 ml    Exam  General: Well developed, well nourished, NAD  HEENT: NCAT, mucous membranes moist.   Cardiovascular: Normal S1/S2, RRR  Respiratory: Diminished, however clear  Psych: Pleasant and appropriate  Data Review   Micro Results Recent Results (from the past 240 hour(s))  Blood culture (routine x 2)     Status: None (Preliminary result)   Collection Time: 01/20/15  4:50 PM  Result Value Ref Range Status   Specimen Description BLOOD RIGHT HAND  Final   Special Requests BOTTLES DRAWN AEROBIC AND ANAEROBIC 5CC EACH  Final   Culture   Final           BLOOD CULTURE RECEIVED NO GROWTH TO DATE CULTURE WILL BE HELD FOR 5 DAYS BEFORE ISSUING A FINAL NEGATIVE REPORT Performed at [facility]    Report Status PENDING  Incomplete  Blood culture (routine x 2)     Status: None (Preliminary result)   Collection Time: 01/20/15   4:55 PM  Result Value Ref Range Status   Specimen Description BLOOD LEFT ARM  Final   Special Requests BOTTLES DRAWN AEROBIC AND ANAEROBIC 10CC EACH  Final   Culture   Final           BLOOD CULTURE RECEIVED NO GROWTH TO DATE CULTURE  WILL BE HELD FOR 5 DAYS BEFORE ISSUING A FINAL NEGATIVE REPORT Performed at [facility]    Report Status PENDING  Incomplete  Culture, Urine     Status: None   Collection Time: 01/20/15  5:00 PM  Result Value Ref Range Status   Specimen Description URINE, CLEAN CATCH  Final   Special Requests NONE  Final   Colony Count   Final    30,000 COLONIES/ML Performed at [facility]    Culture   Final    ESCHERICHIA COLI Performed at [facility]    Report Status 01/23/2015 FINAL  Final   Organism ID, Bacteria ESCHERICHIA COLI  Final      Susceptibility   Escherichia coli - MIC*    AMPICILLIN <=2 SENSITIVE Sensitive     CEFAZOLIN <=4 SENSITIVE Sensitive     CEFTRIAXONE <=1 SENSITIVE Sensitive     CIPROFLOXACIN <=0.25 SENSITIVE Sensitive     GENTAMICIN <=1 SENSITIVE Sensitive     LEVOFLOXACIN <=0.12 SENSITIVE Sensitive     NITROFURANTOIN <=16 SENSITIVE Sensitive     TOBRAMYCIN <=1 SENSITIVE Sensitive     TRIMETH/SULFA <=20 SENSITIVE Sensitive     PIP/TAZO <=4 SENSITIVE Sensitive     * ESCHERICHIA COLI    Radiology Reports Dg Chest 2 View  01/23/2015   CLINICAL DATA:  81 year old male with productive cough, fever, feels ill, shortness of breath. Initial encounter.  EXAM: CHEST  2 VIEW  COMPARISON:  01/21/2015 and earlier.  FINDINGS: Stable lung volumes. Moderate chronic elevation of the right hemidiaphragm. Continued in mildly increased streaky 0 *SCRATCH* sec continued and mildly increased streaky opacity at both lung bases. No pneumothorax, pulmonary edema, pleural effusion, or consolidation. Visualized tracheal air column is within normal limits. Stable cardiac size and mediastinal contours. No acute osseous abnormality  identified.  IMPRESSION: Chronic but mildly increased streaky opacity at both lung bases. Favor atelectasis but difficult to exclude lung base infection.   Electronically Signed   By: Odessa FlemingH  Hall M.D.   On: 01/23/2015 08:17   Dg Chest 2 View  01/21/2015   CLINICAL DATA:  81 year old male with a history of cough.  EXAM: CHEST - 2 VIEW  COMPARISON:  01/20/2015, 11/24/2014  FINDINGS: Cardiomediastinal silhouette unchanged in size and contour. No evidence of pulmonary vascular congestion.  Lung volumes remain low with persistent elevation of the right hemidiaphragm.  No evidence of pleural effusion. Linear opacities on the lateral view without confluent airspace disease. No pneumothorax.  Accentuated kyphotic curvature on the lateral view.  Osteopenia with no displaced fracture.  IMPRESSION: Persistently low lung volumes with atelectasis and/ scarring at the lung bases. No evidence of lobar pneumonia.  Signed,  Yvone NeuJaime S. Loreta AveWagner, DO  [facility]  [facility]   Electronically Signed   By: Gilmer MorJaime  Wagner D.O.   On: 01/21/2015 09:25   Dg Chest 2 View  01/20/2015   CLINICAL DATA:  Cough  EXAM: CHEST  2 VIEW  COMPARISON:  11/24/2014  FINDINGS: Cardiac shadow is stable. Elevation of the right hemidiaphragm is again seen. The previously seen atelectasis has improved on the left. Some mild right basilar atelectasis is noted. No other focal abnormality is seen.  IMPRESSION: Mild right basilar atelectasis.   Electronically Signed   By: Alcide CleverMark  Lukens M.D.   On: 01/20/2015 13:09    CBC  Recent Labs Lab 01/20/15 1146 01/21/15 0516 01/22/15 0554 01/23/15 0546  WBC 14.6* 14.5* 13.1* 11.3*  HGB 13.2 12.3* 12.1*  12.2*  HCT 40.1 37.0* 36.6* 37.3*  PLT 314 315 339 382  MCV 90.9 90.0 89.3 89.4  MCH 29.9 29.9 29.5 29.3  MCHC 32.9 33.2 33.1 32.7  RDW 12.8 12.8 12.8 12.8  LYMPHSABS 1.8 1.9  --   --   MONOABS 2.6* 2.1*  --   --   EOSABS 0.2 0.2  --   --   BASOSABS 0.1 0.1  --    --     Chemistries   Recent Labs Lab 01/20/15 1146 01/21/15 0516 01/22/15 0554 01/23/15 0546  NA 133* 134* 137 138  K 3.8 3.7 3.4* 3.8  CL 98 98 102 102  CO2 27 26 27 27   GLUCOSE 211* 172* 138* 178*  BUN 14 10 11 8   CREATININE 0.84 0.67 0.63 0.72  CALCIUM 8.8 8.4 8.1* 8.6  AST  --  19  --   --   ALT  --  23  --   --   ALKPHOS  --  68  --   --   BILITOT  --  0.4  --   --    ------------------------------------------------------------------------------------------------------------------ estimated creatinine clearance is 80.9 mL/min (by C-G formula based on Cr of 0.72). ------------------------------------------------------------------------------------------------------------------  Recent Labs  01/20/15 2327  HGBA1C 7.1*   ------------------------------------------------------------------------------------------------------------------ No results for input(s): CHOL, HDL, LDLCALC, TRIG, CHOLHDL, LDLDIRECT in the last 72 hours. ------------------------------------------------------------------------------------------------------------------ No results for input(s): TSH, T4TOTAL, T3FREE, THYROIDAB in the last 72 hours.  Invalid input(s): FREET3 ------------------------------------------------------------------------------------------------------------------ No results for input(s): VITAMINB12, FOLATE, FERRITIN, TIBC, IRON, RETICCTPCT in the last 72 hours.  Coagulation profile No results for input(s): INR, PROTIME in the last 168 hours.  No results for input(s): DDIMER in the last 72 hours.  Cardiac Enzymes No results for input(s): CKMB, TROPONINI, MYOGLOBIN in the last 168 hours.  Invalid input(s): CK ------------------------------------------------------------------------------------------------------------------ Invalid input(s): POCBNP    Janie Strothman D.O. on 01/23/2015 at 12:36 PM  Between 7am to 7pm - Pager - [phone]  After 7pm go to [email or web address]  - password [email or web address]  And look for the night coverage person covering for me after hours  [facility] Office  [phone]

## 2015-01-24 LAB — GLUCOSE, CAPILLARY: GLUCOSE-CAPILLARY: 168 mg/dL — AB (ref 70–99)

## 2015-01-24 MED ORDER — CEFUROXIME AXETIL 500 MG PO TABS: 500.0000 mg | ORAL_TABLET | Freq: Two times a day (BID) | ORAL | Status: DC

## 2015-01-24 NOTE — Progress Notes (Signed)
CSW consulted for SNF placement. PN reviewed. [facility] RNCM assisted with d/c planning.  Cori RazorJamie Caterra Ostroff LCSW [phone]

## 2015-01-24 NOTE — Progress Notes (Signed)
Discharge instructions reviewed with patient and patient's spouse, patient is stable, patient's spouse instructed on follow up appointments and to pick up antibiotic from [facility], questions and concerns answered Stanford BreedBracey, Ella Golomb N RN 11:54 AM 01-24-2015

## 2015-01-24 NOTE — Progress Notes (Signed)
Physical Therapy Treatment Patient Details Name: Richard Lawson MRN: [id number] DOB: 1932/05/10 Today's Date: 01/24/2015    History of Present Illness 81 yo male admitted with sepsis, Pna. Hx of DM, Afib, blindness.     PT Comments    Pt continues to require Min assist for mobility-mostly for safety/guidance in unfamiliar environment. Received new order asking PT to reassess for SNF placement. Wife present during session (as she was on yesterday)-now considering ST rehab at SNF-she has concerns about being able to safely manage with pt, especially in evenings/night. Explained to pt that she can consider sitter/aide and that it would likely be private pay. Wife states that she will continue to discuss options with CM/SW on today.   Follow Up Recommendations  Supervision/Assistance - 24 hour;Home health PT (vs SNF-depends on pt/wife decision-wife now considering ST SNF at discharge-she is unsure if she can safely manage pt in home, especially at night time)     Equipment Recommendations  None recommended by PT    Recommendations for Other Services       Precautions / Restrictions Precautions Precautions: Fall Precaution Comments: blind Restrictions Weight Bearing Restrictions: No    Mobility  Bed Mobility Overal bed mobility: Needs Assistance Bed Mobility: Supine to Sit     Supine to sit: Min assist     General bed mobility comments: small amount of assist to get trunk to full upright and to scoot fully to EOB. Increased time. Multimodal cues for safety, technique.   Transfers Overall transfer level: Needs assistance   Transfers: Sit to/from Stand Sit to Stand: Min guard         General transfer comment: close guard for safety with initial stand. Multiple attempts to rise from bed surface.   Ambulation/Gait Ambulation/Gait assistance: Min assist Ambulation Distance (Feet): 152 Feet Assistive device:  (walking stick + therapist's shoulder with opposite hand) Gait  Pattern/deviations: Step-through pattern;Decreased stride length     General Gait Details: Min assist to guide in unfamiliar environment. Pt held onto therapist's shoulder with one hand and walking stick in opposite hand. Pt tolerated activity/distance well.    Stairs            Wheelchair Mobility    Modified Rankin (Stroke Patients Only)       Balance Overall balance assessment: History of Falls;Needs assistance         Standing balance support: Single extremity supported;During functional activity Standing balance-Leahy Scale: Fair                      Cognition Arousal/Alertness: Awake/Lawson Behavior During Therapy: WFL for tasks assessed/performed   Area of Impairment: Orientation Orientation Level: Place;Time   Memory: Decreased short-term memory              Exercises      General Comments        Pertinent Vitals/Pain Pain Assessment: No/denies pain    Home Living                      Prior Function            PT Goals (current goals can now be found in the care plan section) Progress towards PT goals: Progressing toward goals    Frequency  Min 3X/week    PT Plan Current plan remains appropriate    Co-evaluation             End of Session Equipment Utilized During Treatment: Gait belt  Activity Tolerance: Patient tolerated treatment well Patient left: in bed;with call bell/phone within reach;with family/visitor present     Time: 1610-9604 PT Time Calculation (min) (ACUTE ONLY): 21 min  Charges:  $Gait Training: 8-22 mins                    G Codes:      Richard Lawson, MPT Pager: [phone]

## 2015-01-26 LAB — CULTURE, BLOOD (ROUTINE X 2)
CULTURE: NO GROWTH
Culture: NO GROWTH

## 2015-02-07 ENCOUNTER — Ambulatory Visit (INDEPENDENT_AMBULATORY_CARE_PROVIDER_SITE_OTHER): Payer: Medicare Other | Admitting: Neurology

## 2015-02-07 DIAGNOSIS — R0683 Snoring: Secondary | ICD-10-CM

## 2015-02-07 DIAGNOSIS — I480 Paroxysmal atrial fibrillation: Secondary | ICD-10-CM

## 2015-02-07 DIAGNOSIS — G478 Other sleep disorders: Secondary | ICD-10-CM

## 2015-02-07 DIAGNOSIS — R0902 Hypoxemia: Secondary | ICD-10-CM

## 2015-02-07 DIAGNOSIS — Z9981 Dependence on supplemental oxygen: Secondary | ICD-10-CM

## 2015-02-07 NOTE — Sleep Study (Signed)
Please see the scanned sleep study interpretation located in the Procedure tab within the Chart Review section. 

## 2015-02-16 ENCOUNTER — Encounter: Payer: Self-pay | Admitting: Neurology

## 2015-02-16 ENCOUNTER — Telehealth: Payer: Self-pay | Admitting: *Deleted

## 2015-02-16 ENCOUNTER — Other Ambulatory Visit: Payer: Self-pay | Admitting: Neurology

## 2015-02-16 DIAGNOSIS — G4733 Obstructive sleep apnea (adult) (pediatric): Secondary | ICD-10-CM

## 2015-02-16 DIAGNOSIS — R0902 Hypoxemia: Secondary | ICD-10-CM

## 2015-02-16 NOTE — Telephone Encounter (Signed)
The patient and his wife called and were provided the results of his sleep study that did reveal OSA as well as hypoxemia.  The patient was advised that a CPAP therapy study had been ordered by Dr. Vickey Hugerohmeier.  The patient scheduled his in-lab CPAP titration sleep study for 8:00 pm on April 10th.  Dr. Jacinto HalimGanji was faxed a copy of the results.

## 2015-02-19 ENCOUNTER — Ambulatory Visit (INDEPENDENT_AMBULATORY_CARE_PROVIDER_SITE_OTHER): Payer: Medicare Other | Admitting: Neurology

## 2015-02-19 DIAGNOSIS — R0902 Hypoxemia: Secondary | ICD-10-CM

## 2015-02-19 DIAGNOSIS — G4733 Obstructive sleep apnea (adult) (pediatric): Secondary | ICD-10-CM | POA: Diagnosis not present

## 2015-02-20 NOTE — Sleep Study (Signed)
See scanned documents in Encounters tab 

## 2015-03-03 ENCOUNTER — Telehealth: Payer: Self-pay | Admitting: Neurology

## 2015-03-03 ENCOUNTER — Encounter: Payer: Self-pay | Admitting: Neurology

## 2015-03-03 NOTE — Telephone Encounter (Signed)
I called wife back. She is concerned.    She is asking about sleep results.  Got results  from Stann Mainlandavid P on 02-16-15 for sleep study and is then  asking about cpap titration study results on study done 02-19-15.    Has low oxygen on O25L now, after ono study per Dr. Jacinto HalimGanji (below 70% by 360min) per wifes understanding.  Uses [employer] as DME, but is ok to change on our recommedation.  I told her would forward note to  Dr. Epimenio FootSater who is reading sleep study's in Dr. Oliva Bustardohmeier's absence.

## 2015-03-03 NOTE — Telephone Encounter (Signed)
Shirlee LimerickMarion, pt's wife called wanting to know what is going on with pt's treatment. Shirlee LimerickMarion wants to know what is the next step in his treatment after his sleep study tests. She would like for Dr. Vickey Hugerohmeier or the nurse to explain to her what the patient should expect with his treatment. Please call and advice. # [phone]

## 2015-03-06 ENCOUNTER — Encounter: Payer: Self-pay | Admitting: Neurology

## 2015-03-06 DIAGNOSIS — R0902 Hypoxemia: Secondary | ICD-10-CM | POA: Insufficient documentation

## 2015-03-06 DIAGNOSIS — G4733 Obstructive sleep apnea (adult) (pediatric): Secondary | ICD-10-CM | POA: Insufficient documentation

## 2015-03-06 NOTE — Telephone Encounter (Signed)
Called left message    03/06/15   At 510 pm

## 2015-03-07 NOTE — Telephone Encounter (Signed)
I spoke with both Richard Lawson--and gave results of both PSG and cpap titration. I explained the difference between the 2 tests (psg is diagnostic and cpap titration determines cpap pressures that will effectively control osa.)  They verbalized understanding of same and are agreeable to proceeding with cpap therapy.  Per RAS, they need cpap, pressure of 6, with 2L of oxygen bled in/fim

## 2015-03-07 NOTE — Telephone Encounter (Signed)
Please set up the DME for Richard Lawson.

## 2015-03-07 NOTE — Telephone Encounter (Signed)
LMOM for pt/wife to call for sleep study results/fim

## 2015-03-07 NOTE — Telephone Encounter (Signed)
Shirlee LimerickMarion, pt's wife called/returning Faith's call. C/B [phone] and/ or [phone]

## 2015-03-08 ENCOUNTER — Encounter: Payer: Self-pay | Admitting: *Deleted

## 2015-03-08 ENCOUNTER — Other Ambulatory Visit: Payer: Self-pay | Admitting: Neurology

## 2015-03-08 DIAGNOSIS — R0902 Hypoxemia: Secondary | ICD-10-CM

## 2015-03-08 DIAGNOSIS — G4733 Obstructive sleep apnea (adult) (pediatric): Secondary | ICD-10-CM

## 2015-03-08 NOTE — Telephone Encounter (Signed)
Please electronically sign order I created for you and then I can fax his existing DME [facility] the referral.  I have already spoken with patient.

## 2015-04-17 ENCOUNTER — Telehealth: Payer: Self-pay | Admitting: Neurology

## 2015-04-17 NOTE — Telephone Encounter (Signed)
Returned pt's phone call. Pt's wife is upset because she thought that there was an appt for today and she called and was told there was not an appt. Made an appt for her 04/19/15 at 10:30. Asked her to bring pt's cpap and arrive 15 minutes early. Pt's wife verbalized understanding.

## 2015-04-17 NOTE — Telephone Encounter (Signed)
Patients wife also stated she can be reached at ([phone].

## 2015-04-17 NOTE — Telephone Encounter (Signed)
Patients wife(Marion) called stating she thought he had an appointment today but there is nothing to show he had a return visit scheduled. She is concerned if his CPAP machine is working correctly. She feels he needs a follow up appointment. She is not happy and feels they are getting pushed around. Please call and advise to get him an appointment for the month of June. She can be reached at [phone].

## 2015-04-19 ENCOUNTER — Encounter: Payer: Self-pay | Admitting: Neurology

## 2015-04-19 ENCOUNTER — Ambulatory Visit (INDEPENDENT_AMBULATORY_CARE_PROVIDER_SITE_OTHER): Payer: Medicare Other | Admitting: Neurology

## 2015-04-19 VITALS — BP 116/70 | HR 78 | Resp 20 | Ht 68.0 in | Wt 207.0 lb

## 2015-04-19 DIAGNOSIS — G4733 Obstructive sleep apnea (adult) (pediatric): Secondary | ICD-10-CM | POA: Diagnosis not present

## 2015-04-19 DIAGNOSIS — Z9989 Dependence on other enabling machines and devices: Secondary | ICD-10-CM

## 2015-04-19 DIAGNOSIS — R0902 Hypoxemia: Secondary | ICD-10-CM | POA: Diagnosis not present

## 2015-04-19 DIAGNOSIS — J189 Pneumonia, unspecified organism: Secondary | ICD-10-CM | POA: Diagnosis not present

## 2015-04-19 DIAGNOSIS — H547 Unspecified visual loss: Secondary | ICD-10-CM | POA: Insufficient documentation

## 2015-04-19 DIAGNOSIS — R938 Abnormal findings on diagnostic imaging of other specified body structures: Secondary | ICD-10-CM | POA: Diagnosis not present

## 2015-04-19 DIAGNOSIS — I480 Paroxysmal atrial fibrillation: Secondary | ICD-10-CM

## 2015-04-19 DIAGNOSIS — H540 Blindness, both eyes: Secondary | ICD-10-CM | POA: Diagnosis not present

## 2015-04-19 DIAGNOSIS — R918 Other nonspecific abnormal finding of lung field: Secondary | ICD-10-CM | POA: Insufficient documentation

## 2015-04-19 NOTE — Progress Notes (Signed)
[facility]   Provider:  Melvyn Novas, M D  Referring Provider: Yates Decamp, MD   Primary Care Physician:   [name], MD  Chief Complaint  Patient presents with  . Follow-up    cpap, rm 10, with wife    HPI:  Richard Lawson is a 81 y.o. male seen here as a referral  from Dr. Yates Decamp , cardiologist.  Richard Lawson is a new patient to my practice, referred here today by his cardiologist. His primary care physician is Dr. Kirby Funk. He is here for a concern of nocturnal hypoxemia. Richard Lawson wife is giving me a detailed history she explains that last May Mr. Sharp begun taking metformin for the control of hyperglycemia. After tolerating the entry dose very well metformin dose was doubled. This occurred in September 2015. A visits to his primary care physician led to the discovery of atrial fibrillation 10 days after the dose was doubled. He was the same day referred to Dr. Jacinto Halim  now his cardiologist. Dr. Jacinto Halim found the atrial fibrillation to be paroxysmal occurring and spells. And also explained that the patient is as a yearly risk of stroke of 3.2%. He followed the patient is a nocturnal pulse oximetry on 09-26-14, which revealed the oxygen level to be less than 88% for 275 minutes of the recording. The lowest SPO2 was 71%. This was a markedly abnormal test and the patient was supplied with an oxygen concentrator. Dr. Reece Agar. ordered as pulse oximetry test after the patient had started oxygen supplementation. I do not have the results of the second finger Test available here but Dr.G wrote that the patient would benefit from further sleep evaluation. This is the reason why he is coming here today he also has a mild tricuspid regurgitation and mild mitral valve regurgitation. An echo was performed on 10-13-14 his ejection fraction was 56%. Dr. Reece Agar described a left anterior fascicular block and a poor R-wave progression indicative of  possible pulmonary disease underlying.   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  GERD with Barrett's esophagus, the patient had undergone multiple balloon stretching's with Dr. Melchor Amour, shoulder surgery. [name] explained that her husband was not promptly released after hisshoulder surgery from the hospital,  due to hypoxemia and apparently apneic events in the wake up room. Sleep habits:  PM is bedtime,  He listened to TV or Radio for several hours , falls asleep and wakes up- his wife described a very fragmented sleep pattern, as a blind person is very tuned to audio stimuli,He wakes up at 8 AM , he gets 12 hours of bedtime, unsure how much sleep.His wife rises at 6 AM,  He drinks one cup of coffee, now decaffeinated. He naps on and off. He goes to gymnasium daily and walks a mile.  He has no known family history of  Sleep disordered breathing.   Interval history post PSG and CPAP ;  Date 04-19-15 Richard Lawson and his wife are here today to discuss the results of his recent sleep studies and CPAP compliance report. Richard Lawson, who is blind suffers from diabetes has paroxysmal atrial fibrillation and had witnessed sleep apnea. He had endorsed the Epworth sleepiness score at about 8 points. He was diagnosed with a mild sleep apnea an AHI of 10.8 in rem sleep the apnea exacerbated to 37.8 per hour and he did  not sleep in supine position all night. His oxygen nadir was 79% and there were prolonged periods of low oxygen levels noted-  a total time of 210 minutes of desaturation. He returned for CPAP titration based on the hypoxemia associated with a rather mild apnea. CPAP was titrated but he remained for 66 minutes this oxygen desaturations even during the titration study. His final AHI was 4.9 he had also frequent limb movements at night , my colleague Dr. Despina Ariasichard Sater interpreted the study and stated that also there were severe and frequent limb movements  he did not arouse out of sleep due to that.  His spouse reports that she is awoken by the limb movements. However the patient was set with a CPAP machine at 6 cm water pressure and 3 cm EPR this has not proven to be enough pressure for him also he is 100% compliance by 30 out of 30 days of use over 4 hours his residual AHI was 6.8 the apnea index per se was 1.9 per hour the hypoxemia or shallow breathing index was 4.9. Based on this and to overcome the hypoxemia I will change the settings today to an AUTO  machine 5 through 10 cm .  He continues  to sleep up to 10 hours nighty.   Review of Systems: Out of a complete 14 system review, the patient complains of only the following symptoms, and all other reviewed systems are negative. Complete loss of eye -sight at age 81 . RETINITIS PIGMENTOSA   Epworth score  15 again, 04-19-15 , Fatigue severity score ; 37   , depression score 2    History   Social History  . Marital Status: Married    Spouse Name: N/A  . Number of Children: 1  . Years of Education: coll   Occupational History  . retired    Social History Main Topics  . Smoking status: Never Smoker   . Smokeless tobacco: Not on file  . Alcohol Use: No  . Drug Use: No  . Sexual Activity: Not on file   Other Topics Concern  . Not on file   Social History Narrative   Caffeine 2 glasses daily avg.    Family History  Problem Relation Age of Onset  . Lung cancer Father   . COPD Brother     Past Medical History  Diagnosis Date  . Diabetes mellitus without complication   . Retinitis pigmentosa of both eyes   . GERD (gastroesophageal reflux disease)   . Paroxysmal a-fib     Past Surgical History  Procedure Laterality Date  . Shoulder surgery Right 2010    Current Outpatient Prescriptions  Medication Sig Dispense Refill  . cefUROXime (CEFTIN) 500 MG tablet Take 1 tablet (500 mg total) by mouth 2 (two) times daily with a meal. 4 tablet 0  . edoxaban (SAVAYSA) 60 MG TABS  tablet Take 60 mg by mouth daily.    . metoprolol tartrate (LOPRESSOR) 25 MG tablet Take 25 mg by mouth 2 (two) times daily.     Marland Kitchen. omeprazole (PRILOSEC) 20 MG capsule Take 20 mg by mouth daily.     No current facility-administered medications for this visit.    Allergies as of 04/19/2015  . (No Known Allergies)    Vitals: BP 116/70 mmHg  Pulse 78  Resp 20  Ht 5\' 8"  (1.727 m)  Wt 207 lb (93.895 kg)  BMI 31.48 kg/m2 Last Weight:  Wt Readings from Last 1 Encounters:  04/19/15 207  lb (93.895 kg)       Last Height:   Ht Readings from Last 1 Encounters:  04/19/15  (1.727 m)    Physical exam:  General: The patient is awake, alert and appears not in acute distress. The patient is well groomed. THE PATIENT IS BLIND  Head: Normocephalic, atraumatic. Neck is supple. Mallampati 4  neck circumference:  16.75  inches . Nasal airflow unrestricted - TMJ is not evident . Retrognathia is not *seen.  Cardiovascular:  irregular rate and rhythm, without murmurs or carotid bruit, and without distended neck veins. Respiratory: Lungs are clear to auscultation. Skin:  Without evidence of edema, or rash Trunk: BMI is elevated and patient  has normal posture.  Neurologic exam : The patient is awake and alert, oriented to place and time.   Memory subjective  described as intact. There is a normal attention span & concentration ability. Speech is fluent with dysarthria, dysphonia , not  aphasia. Mood and affect are appropriate. Cranial nerves: Pupils are equal and briskly reactive to light. Retinitis pigmentosa -   Hearing to finger rub intact.  Facial sensation intact to fine touch. Facial motor strength is symmetric and tongue and uvula move midline. Motor exam:  Normal tone, muscle bulk and symmetric strength in all extremities. Sensory:  Fine touch, pinprick and vibration were tested in all extremities.  Proprioception is  normal. Coordination: Rapid alternating movements in the  fingers/hands is normal.  Finger-to-nose maneuver  normal without evidence of ataxia, dysmetria or tremor. Gait and station: Patient walks with assistive device ( cane  For the blind )  and is ableto climb up to the exam table. He uses a cane to orient himself.  Strength within normal limits. Stance is stable and normal.   Romberg testing is negative. Deep tendon reflexes: in the  upper and lower extremities are symmetric and intact.    Assessment:  After physical and neurologic examination, review of laboratory studies, imaging, neurophysiology testing and pre-existing records, assessment is   1) this patient likely has hypoxemia inducing atrial fibrillation, rather than the other way around.  2)Tachy brady rhythms had been noticed in the post surgical wake up room.  3)The sleep studies confirmed mild apnea  4)  but rather severe hypoxemia. My hope is that within the best possible CPAP setting we will also raise the oxygen nadir and reduce the time and total of oxygen desaturation. Results of the sleep study were quoted above. I think the patient is doing best with an output titrated. Right now the pressure is just simply not enough to overcome the mechanical barrier and he has an incomplete resolution of hypoxemia. Plan:  Treatment plan and additional workup :  The patient is audio sensitive , as a blind person. Please attach braille stickers to the machines buttons. The CPAP will be changed to AUTO set 5-10 with 2 cm EPR.  Mask is a FFM, per patients choice. He likes it - I would like to have him use a CPAP pillow. He dislodges the mask more easily. He wants to purchase a oximeter , I would be happy to write a medical necessity for him, to gain coverage.    Porfirio Mylar Liliana Brentlinger MD  04/19/2015

## 2015-04-20 ENCOUNTER — Telehealth: Payer: Self-pay | Admitting: Neurology

## 2015-04-20 ENCOUNTER — Other Ambulatory Visit: Payer: Self-pay

## 2015-04-20 ENCOUNTER — Telehealth: Payer: Self-pay

## 2015-04-20 DIAGNOSIS — G4733 Obstructive sleep apnea (adult) (pediatric): Secondary | ICD-10-CM

## 2015-04-20 DIAGNOSIS — R918 Other nonspecific abnormal finding of lung field: Secondary | ICD-10-CM

## 2015-04-20 DIAGNOSIS — J189 Pneumonia, unspecified organism: Secondary | ICD-10-CM

## 2015-04-20 DIAGNOSIS — R0902 Hypoxemia: Secondary | ICD-10-CM

## 2015-04-20 DIAGNOSIS — I480 Paroxysmal atrial fibrillation: Secondary | ICD-10-CM

## 2015-04-20 NOTE — Telephone Encounter (Signed)
Spoke to Dr. Vickey Huger re: which mask she wanted pt to try. She ordered the dream wear mask. I sent this order to [facility] and informed the pt's wife. She verbalized understanding to call back with any more concerns.

## 2015-04-20 NOTE — Telephone Encounter (Signed)
Returned Richard Lawson's call. I changed the order to CT chest wo contrast per Dr. Oliva Bustard instructions. Informed Lurena Joiner and she says nothing further is needed from [place].

## 2015-04-20 NOTE — Telephone Encounter (Signed)
Patients wife called and requested to speak with the nurse regarding the patients face mask. She stated that [facility] told her that they did not have the face mask that Dr. Vickey Huger requested for the patient. Please call and advise.

## 2015-04-20 NOTE — Telephone Encounter (Signed)
Returned pt's call. Pt's wife stated that [facility] does not carry the mask ordered by Dr. Vickey Huger and they want [facility] to call them to inquire about the mask the pt needs. Forwarded pt's information to [facility] asking them to call pt.

## 2015-04-20 NOTE — Telephone Encounter (Signed)
Lurena Joiner with [facility] is calling regarding an order they have for a CT scan for the patient. Lurena Joiner states the order reads CT high resolution but with the diagnosis the CT high resolution is not necessary and should be changed to CT chest without. Please call and advise. The patient is scheduled tomorrow. Thank you.

## 2015-04-21 ENCOUNTER — Ambulatory Visit
Admission: RE | Admit: 2015-04-21 | Discharge: 2015-04-21 | Disposition: A | Discharge status: Home / Self Care | Payer: Medicare Other | Source: Ambulatory Visit | Attending: Neurology | Admitting: Neurology

## 2015-04-21 DIAGNOSIS — J189 Pneumonia, unspecified organism: Secondary | ICD-10-CM

## 2015-04-21 DIAGNOSIS — I480 Paroxysmal atrial fibrillation: Secondary | ICD-10-CM

## 2015-04-21 DIAGNOSIS — R0902 Hypoxemia: Secondary | ICD-10-CM

## 2015-04-21 DIAGNOSIS — R918 Other nonspecific abnormal finding of lung field: Secondary | ICD-10-CM

## 2015-04-24 ENCOUNTER — Telehealth: Payer: Self-pay | Admitting: Neurology

## 2015-04-24 NOTE — Telephone Encounter (Signed)
Patient's wife would like a call back regarding recent CT. Best number to call is [phone] or [phone].

## 2015-04-24 NOTE — Telephone Encounter (Signed)
Returned pt's wife's call. No answer. Left message asking her to call me back.

## 2015-04-25 NOTE — Telephone Encounter (Signed)
Called pt again, no answer, and left a message on the home machine asking them to call me back.

## 2015-04-25 NOTE — Telephone Encounter (Signed)
Attempted to call pt back. No answer.

## 2015-04-26 ENCOUNTER — Telehealth: Payer: Self-pay | Admitting: Neurology

## 2015-04-26 NOTE — Telephone Encounter (Signed)
Returned pt's phone call. No answer. Left a message asking her to call me back.

## 2015-04-26 NOTE — Telephone Encounter (Signed)
Patient's wife(Marion) called requesting CT results. Please call and advise. She can be reached at [phone].

## 2015-04-27 ENCOUNTER — Telehealth: Payer: Self-pay

## 2015-04-27 NOTE — Telephone Encounter (Signed)
Informed pt and wife that per Dr. Vickey Huger, CT chest normal for age. Pt and wife verbalized understanding.

## 2015-04-27 NOTE — Telephone Encounter (Signed)
Faxed CT results to Dr. [name] at [facility] per pt request.

## 2015-04-27 NOTE — Telephone Encounter (Signed)
Gave pt results of Ct chest. Per Dr. Vickey Huger, it is normal for his age. Informed pt, and pt and wife verbalized understanding.

## 2015-05-02 ENCOUNTER — Telehealth: Payer: Self-pay

## 2015-05-02 NOTE — Telephone Encounter (Signed)
Called pt to give ONO results. Per Dr. Vickey Huger, no additional oxygen was needed. His ONO looked good, with good o2 saturations. Pt's wife verbalized understanding.

## 2015-06-27 ENCOUNTER — Telehealth: Payer: Self-pay

## 2015-06-27 NOTE — Telephone Encounter (Signed)
Received notice from [facility] that the pt's wife had called them concerned about hallucinations the pt was having and she was concerned that it might be cpap related. Called pt to find out what is going on. Pt's wife note available, I asked pt to ask his wife to give me a call back when available.

## 2015-10-02 ENCOUNTER — Telehealth: Payer: Self-pay

## 2015-10-02 DIAGNOSIS — G4733 Obstructive sleep apnea (adult) (pediatric): Secondary | ICD-10-CM

## 2015-10-02 DIAGNOSIS — Z9989 Dependence on other enabling machines and devices: Principal | ICD-10-CM

## 2015-10-02 NOTE — Telephone Encounter (Signed)
Received the following from [facility]:  Baxter HireKristen,   Can you call me or Efraim KaufmannMelissa, our Nature conservation officeroperations manager, on this patient? We are faxing his download over now to you. His pressure is 5-10 and his ahi is still 15, his pressure probably needs to be increased and this is also with 2lpm 02 bled into the pap. His wife reports that she is checking his 02 levels and they are 57, 60, 65, etc. Just let us know what Dr Vickey Hugerohmeier wants to do.    [facility] at [facility]   ---Spoke to Dr. Vickey Hugerohmeier. She recommends increasing the cpap pressure to 5-15 cm H20. She reviewed the latest downloads from pts cpap therapy report.  I called pt and pt's wife and explained this to them. They already have a f/u appt on 12/15. Pt's wife verbalized understanding. Pt's wife is not happy with [facility]. [facility]

## 2015-10-26 ENCOUNTER — Encounter: Payer: Self-pay | Admitting: Neurology

## 2015-10-26 ENCOUNTER — Ambulatory Visit: Payer: Medicare Other | Admitting: Neurology

## 2015-10-26 ENCOUNTER — Ambulatory Visit (INDEPENDENT_AMBULATORY_CARE_PROVIDER_SITE_OTHER): Payer: Medicare Other | Admitting: Neurology

## 2015-10-26 VITALS — BP 118/66 | Resp 20 | Ht 68.0 in | Wt 204.0 lb

## 2015-10-26 DIAGNOSIS — I480 Paroxysmal atrial fibrillation: Secondary | ICD-10-CM

## 2015-10-26 DIAGNOSIS — G4733 Obstructive sleep apnea (adult) (pediatric): Secondary | ICD-10-CM | POA: Diagnosis not present

## 2015-10-26 DIAGNOSIS — H3552 Pigmentary retinal dystrophy: Secondary | ICD-10-CM | POA: Diagnosis not present

## 2015-10-26 DIAGNOSIS — Z9989 Dependence on other enabling machines and devices: Principal | ICD-10-CM

## 2015-10-26 NOTE — Progress Notes (Signed)
SLEEP MEDICINE CLINIC   Provider:  Melvyn Novasarmen  Kamsiyochukwu Lawson, M D  Referring Provider: Yates DecampJay Ganji, MD   Primary Care Physician:   Richard Lopeoss, Alan, MD  Chief Complaint  Patient presents with  . Follow-up    cpap, rm 10, with wife    HPI:  Richard Lawson is a 81 y.o. male seen here as a referral  from Dr. Yates DecampJay Lawson , cardiologist.  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 His primary care physician is Dr. Kirby FunkJohn Lawson.  He is here for a concern of nocturnal hypoxemia. Richard Lawson's wife is giving me a detailed history she explains that last May Richard Lawson begun taking metformin for the control of hyperglycemia. After tolerating the entry dose very well metformin dose was doubled. This occurred in September 2015. A visits to his primary care physician led to the discovery of atrial fibrillation 10 days after the dose was doubled. He was the same day referred to Dr. Jacinto Lawson  now his cardiologist. Dr. Jacinto Lawson found the atrial fibrillation to be paroxysmal occurring and spells. And also explained that the patient is as a yearly risk of stroke of 3.2%. He followed the patient is a nocturnal pulse oximetry on 09-26-14, which revealed the oxygen level to be less than 88% for 275 minutes of the recording. The lowest SPO2 was 71%. This was a markedly abnormal test and the patient was supplied with an oxygen concentrator. Dr. Reece AgarG. ordered as pulse oximetry test after the patient had started oxygen supplementation. I do not have the results of the second finger Test available here but Dr.G wrote that the patient would benefit from further sleep evaluation. This is the reason why he is coming here today he also has a mild tricuspid regurgitation and mild mitral valve regurgitation. An echo was performed on 10-13-14 his ejection fraction was 56%. Dr. Reece AgarG described a left anterior fascicular block and a poor R-wave progression indicative of  possible pulmonary disease  underlying.   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  GERD with Barrett's esophagus, the patient had undergone multiple balloon stretching's with Dr. Melchor Lawson, shoulder surgery. Richard Lawson explained that her husband was not promptly released after hisshoulder surgery from the hospital,  due to hypoxemia and apparently apneic events in the wake up room. Sleep habits:  PM is bedtime,  He listened to TV or Radio for several hours , falls asleep and wakes up- his wife described a very fragmented sleep pattern, as a blind person is very tuned to audio stimuli,He wakes up at 8 AM , he gets 12 hours of bedtime, unsure how much sleep.His wife rises at 6 AM,  He drinks one cup of coffee, now decaffeinated. He naps on and off. He goes to gymnasium daily and walks a mile.  He has no known family history of  Sleep disordered breathing.   Interval history post PSG and CPAP ;  Date 04-19-15 Richard Lawson and his wife are here today to discuss the results of his recent sleep studies and CPAP compliance report. Richard Lawson, who is blind suffers from diabetes has paroxysmal atrial fibrillation and had witnessed sleep apnea. He had endorsed the Epworth sleepiness score at about 8 points. He was diagnosed with a mild sleep apnea an AHI of 10.8 in rem sleep the apnea exacerbated to 37.8 per hour and  he did not sleep in supine position all night. His oxygen nadir was 79% and there were prolonged periods of low oxygen levels noted-  a total time of 210 minutes of desaturation. He returned for CPAP titration based on the hypoxemia associated with a rather mild apnea. CPAP was titrated but he remained for 66 minutes this oxygen desaturations even during the titration study. His final AHI was 4.9 he had also frequent limb movements at night , my colleague Dr. Despina Lawson interpreted the study and stated that also there were severe and  frequent limb movements he did not arouse out of sleep due to that.  His spouse reports that she is awoken by the limb movements. However the patient was set with a CPAP machine at 6 cm water pressure and 3 cm EPR this has not proven to be enough pressure for him also he is 100% compliance by 30 out of 30 days of use over 4 hours his residual AHI was 6.8 the apnea index per se was 1.9 per hour the hypoxemia or shallow breathing index was 4.9. Based on this and to overcome the hypoxemia I will change the settings today to an AUTO  machine 5 through 10 cm .  He continues  to sleep up to 10 hours nighty.   Mrs. Richard Lawson reports today in her revisit with her husband from 10-26-15 that her husband has been sleeping quieter and less restless than ever before. Just yesterday a [employer] agent stop by the house to exchange his nasal mask for a smaller size of the same model he used before. After our last visit in early June I had ordered an overnight pulse oximetry for the patient this was performed while he used his CPAP as well as oxygen. The oxygen had been previously ordered by Dr. Adelfa Lawson. The patient had no need for additional or higher doses of oxygen for 94% of the night his oxygenation was over 90%. I'm also here today to review his compliance with CPAP. He has used the machine 93% of the time for over 4 hours of consecutive nightly use. 8 hours and 52 minutes. He is using an AutoSet between 5 and 15 cm water pressure was 2 cm EPR his residual apnea index is 5.3. This is elevated but it is a huge decrease in comparison to his baseline. There are no central or obstructive apneas left only hypotony is. These are characterized as unknown events on the download. Within normal mask he will certainly have less air leaks which will also facilitate a better therapy. The AHI peaked on November 20 and has been decreased ever since.   Review of Systems: Out of a complete 14 system review, the patient complains of only the  following symptoms, and all other reviewed systems are negative. Complete loss of eye -sight at age 35 . RETINITIS PIGMENTOSA   Epworth score  9 from 15  ( 04-19-15) , Fatigue severity score ;  32 from 37   , depression score 2    Social History   Social History  . Marital Status: Married    Spouse Name: N/A  . Number of Children: 1  . Years of Education: coll   Occupational History  . retired    Social History Main Topics  . Smoking status: Never Smoker   . Smokeless tobacco: Not on file  . Alcohol Use: No  . Drug Use: No  . Sexual Activity: Not on file   Other Topics Concern  .  Not on file   Social History Narrative   Caffeine 2 glasses daily avg.    Family History  Problem Relation Age of Onset  . Lung cancer Father   . COPD Brother     Past Medical History  Diagnosis Date  . Diabetes mellitus without complication (HCC)   . Retinitis pigmentosa of both eyes   . GERD (gastroesophageal reflux disease)   . Paroxysmal a-fib [facility])     Past Surgical History  Procedure Laterality Date  . Shoulder surgery Right 2010    Current Outpatient Prescriptions  Medication Sig Dispense Refill  . edoxaban (SAVAYSA) 60 MG TABS tablet Take 60 mg by mouth daily.    . metoprolol tartrate (LOPRESSOR) 25 MG tablet Take 25 mg by mouth 2 (two) times daily.     Marland Kitchen omeprazole (PRILOSEC) 20 MG capsule Take 20 mg by mouth daily.     No current facility-administered medications for this visit.    Allergies as of 10/26/2015  . (No Known Allergies)    Vitals: BP 118/66 mmHg  Resp 20  Ht  (1.727 m)  Wt 204 lb (92.534 kg)  BMI 31.03 kg/m2 Last Weight:  Wt Readings from Last 1 Encounters:  10/26/15 204 lb (92.534 kg)       Last Height:   Ht Readings from Last 1 Encounters:  10/26/15  (1.727 m)    Physical exam:  General: The patient is awake, alert and appears not in acute distress. The patient is well groomed. THE PATIENT IS BLIND  Head: Normocephalic,  atraumatic. Neck is supple. Mallampati 4  neck circumference:  16.75  inches . Nasal airflow unrestricted - TMJ is not evident . Retrognathia is not *seen.  Cardiovascular:  irregular rate and rhythm, without murmurs or carotid bruit, and without distended neck veins. Respiratory: Lungs are clear to auscultation. Skin:  Without evidence of edema, or rash Trunk: BMI is elevated and patient  has normal posture.  Neurologic exam : The patient is awake and alert, oriented to place and time.   Memory subjective  described as intact. There is a normal attention span & concentration ability. Speech is fluent with dysarthria, dysphonia , not  aphasia. Mood and affect are appropriate. Cranial nerves: Pupils are equal and briskly reactive to light. Retinitis pigmentosa -   Hearing to finger rub intact.  Facial sensation intact to fine touch. Facial motor strength is symmetric and tongue and uvula move midline.  Assessment:  After physical and neurologic examination, review of laboratory studies, imaging, neurophysiology testing and pre-existing records, assessment is    1) He has had no atrial fib on the night of ONO, with consecutive use of CPAP and Oxygen . The sleep studies confirmed mild apnea , the patient has a pulse oximetry at home and his wife has taken sample readings almost every night. He is usually above 89%.  [facility] .  RV with me in 12 month.   Porfirio Mylar Colman Birdwell MD  10/26/2015

## 2015-11-24 ENCOUNTER — Telehealth: Payer: Self-pay | Admitting: Neurology

## 2015-11-24 NOTE — Telephone Encounter (Signed)
Spouse called to advise they didn't get the exit interview like they should have in December with Dr. Vickey Hugerohmeier, states husband doesn't have appointment with Dr. Vickey Hugerohmeier until December 2017, feels husband needs additional review, states "things are not fine, they are going through a living hell at night", husband is suffering with something like dementia, not sure what it is but no one is addressing, states this has happened with Metformin patient's. No one has helped her analyze or help with what to do. Wife doesn't feel they can live with nightly interruptions of sleep at night. Wife is suggesting to see Dr. Anne HahnWillis for issue of possible dementia.

## 2015-11-27 NOTE — Telephone Encounter (Signed)
Spoke to pt's wife. She is stating that the pt gets up several times per night to get some water because his mouth is dry from the cpap. She is asking for an order to increase the humidifier. I advised her that I would send a message to [facility] and ask them to take a look at it and if they need an order from us, we can generate that order. If this does not help, we can bring the pt in for an appt with Dr. Vickey Hugerohmeier to discuss further. Pt's wife verbalized understanding.  Pt's wife is also concerned about pt's memory. She states that is has gotten worse, and thinks it has not been addressed. She is requesting to see Dr. Anne HahnWillis for pt's memory. I advised her that pt's PCP Dr. Tenny Crawoss would need to send us a referral for pt's memory problems, and then our office will call her to schedule, and she should request to see Dr. Anne HahnWillis. Pt's wife verbalized understanding.

## 2015-12-28 ENCOUNTER — Telehealth: Payer: Self-pay | Admitting: Neurology

## 2015-12-28 NOTE — Telephone Encounter (Signed)
Spoke to pt's wife. She informed me that there is a piece of the humidifier on the pt's cpap that needs to be replaced and [facility] has taken too long to get it and now [facility] is concerned about the pt's HR being too low and [facility] thinks it may be because pt's cpap and oxygen hasn't been worn for the past two nights. Pt's wife is unsure if the part about pt's HR and oxygen/cpap use is correlated, but she does want to switch from [facility] to another DME because they are unhappy with [facility]'s service. I recommended [facility] to them, and I asked [facility] to call pt and his wife to discuss the possibility of a transfer of care. I gave pt's wife [employer]'s number. Pt's wife verbalized understanding.  [facility] representative said they would call and discuss with pt as soon as possible.

## 2015-12-28 NOTE — Telephone Encounter (Signed)
Patient's wife is calling regarding the patient. The patient is having extreme difficulty in getting the part for his CPAP with [facility]. The patient goes to [facility] and they are saying the patient's heart rate is not what it should be because of not using his CPAP for the past 2 nights. Please call and discuss because they are very concerned. If the patient's wife cannot be reached at home# [phone] please call her cell# at [phone].

## 2016-01-12 ENCOUNTER — Observation Stay (HOSPITAL_COMMUNITY)
Admission: EM | Admit: 2016-01-12 | Discharge: 2016-01-16 | Disposition: A | Discharge status: Home Health | Payer: Medicare Other | Attending: Internal Medicine | Admitting: Internal Medicine

## 2016-01-12 ENCOUNTER — Encounter (HOSPITAL_COMMUNITY): Payer: Self-pay | Admitting: *Deleted

## 2016-01-12 ENCOUNTER — Emergency Department (HOSPITAL_COMMUNITY): Payer: Medicare Other

## 2016-01-12 DIAGNOSIS — Z6828 Body mass index (BMI) 28.0-28.9, adult: Secondary | ICD-10-CM | POA: Diagnosis not present

## 2016-01-12 DIAGNOSIS — Z9119 Patient's noncompliance with other medical treatment and regimen: Secondary | ICD-10-CM | POA: Insufficient documentation

## 2016-01-12 DIAGNOSIS — E43 Unspecified severe protein-calorie malnutrition: Secondary | ICD-10-CM | POA: Insufficient documentation

## 2016-01-12 DIAGNOSIS — R001 Bradycardia, unspecified: Secondary | ICD-10-CM | POA: Diagnosis not present

## 2016-01-12 DIAGNOSIS — R05 Cough: Secondary | ICD-10-CM

## 2016-01-12 DIAGNOSIS — G4733 Obstructive sleep apnea (adult) (pediatric): Secondary | ICD-10-CM | POA: Diagnosis present

## 2016-01-12 DIAGNOSIS — E1165 Type 2 diabetes mellitus with hyperglycemia: Secondary | ICD-10-CM | POA: Diagnosis not present

## 2016-01-12 DIAGNOSIS — Z79899 Other long term (current) drug therapy: Secondary | ICD-10-CM | POA: Insufficient documentation

## 2016-01-12 DIAGNOSIS — H540 Blindness, both eyes: Secondary | ICD-10-CM | POA: Diagnosis not present

## 2016-01-12 DIAGNOSIS — R4182 Altered mental status, unspecified: Secondary | ICD-10-CM | POA: Diagnosis present

## 2016-01-12 DIAGNOSIS — F0390 Unspecified dementia without behavioral disturbance: Secondary | ICD-10-CM | POA: Diagnosis present

## 2016-01-12 DIAGNOSIS — R569 Unspecified convulsions: Secondary | ICD-10-CM

## 2016-01-12 DIAGNOSIS — I480 Paroxysmal atrial fibrillation: Secondary | ICD-10-CM | POA: Diagnosis present

## 2016-01-12 DIAGNOSIS — H547 Unspecified visual loss: Secondary | ICD-10-CM | POA: Diagnosis present

## 2016-01-12 DIAGNOSIS — Z9989 Dependence on other enabling machines and devices: Secondary | ICD-10-CM

## 2016-01-12 DIAGNOSIS — E119 Type 2 diabetes mellitus without complications: Secondary | ICD-10-CM

## 2016-01-12 DIAGNOSIS — R4189 Other symptoms and signs involving cognitive functions and awareness: Secondary | ICD-10-CM

## 2016-01-12 DIAGNOSIS — R55 Syncope and collapse: Secondary | ICD-10-CM | POA: Diagnosis not present

## 2016-01-12 DIAGNOSIS — Z7901 Long term (current) use of anticoagulants: Secondary | ICD-10-CM | POA: Insufficient documentation

## 2016-01-12 DIAGNOSIS — H3552 Pigmentary retinal dystrophy: Secondary | ICD-10-CM | POA: Diagnosis not present

## 2016-01-12 DIAGNOSIS — R059 Cough, unspecified: Secondary | ICD-10-CM

## 2016-01-12 HISTORY — DX: Other symptoms and signs involving cognitive functions and awareness: R41.89

## 2016-01-12 HISTORY — DX: Pneumonia, unspecified organism: J18.9

## 2016-01-12 HISTORY — DX: Dependence on other enabling machines and devices: Z99.89

## 2016-01-12 HISTORY — DX: Unspecified dementia, unspecified severity, without behavioral disturbance, psychotic disturbance, mood disturbance, and anxiety: F03.90

## 2016-01-12 HISTORY — DX: Obstructive sleep apnea (adult) (pediatric): G47.33

## 2016-01-12 HISTORY — DX: Personal history of other diseases of the digestive system: Z87.19

## 2016-01-12 HISTORY — DX: Type 2 diabetes mellitus without complications: E11.9

## 2016-01-12 HISTORY — DX: Dependence on supplemental oxygen: Z99.81

## 2016-01-12 LAB — CBC WITH DIFFERENTIAL/PLATELET
BASOS ABS: 0.1 10*3/uL (ref 0.0–0.1)
BASOS PCT: 1 %
Eosinophils Absolute: 0.2 10*3/uL (ref 0.0–0.7)
Eosinophils Relative: 2 %
HEMATOCRIT: 43.9 % (ref 39.0–52.0)
Hemoglobin: 14.9 g/dL (ref 13.0–17.0)
LYMPHS PCT: 21 %
Lymphs Abs: 1.6 10*3/uL (ref 0.7–4.0)
MCH: 30.8 pg (ref 26.0–34.0)
MCHC: 33.9 g/dL (ref 30.0–36.0)
MCV: 90.7 fL (ref 78.0–100.0)
Monocytes Absolute: 0.7 10*3/uL (ref 0.1–1.0)
Monocytes Relative: 9 %
NEUTROS ABS: 4.9 10*3/uL (ref 1.7–7.7)
NEUTROS PCT: 67 %
PLATELETS: 248 10*3/uL (ref 150–400)
RBC: 4.84 MIL/uL (ref 4.22–5.81)
RDW: 12.9 % (ref 11.5–15.5)
WBC: 7.4 10*3/uL (ref 4.0–10.5)

## 2016-01-12 LAB — URINALYSIS, ROUTINE W REFLEX MICROSCOPIC
Bilirubin Urine: NEGATIVE
GLUCOSE, UA: NEGATIVE mg/dL
HGB URINE DIPSTICK: NEGATIVE
Ketones, ur: 15 mg/dL — AB
LEUKOCYTES UA: NEGATIVE
Nitrite: NEGATIVE
PH: 7.5 (ref 5.0–8.0)
Protein, ur: NEGATIVE mg/dL
Specific Gravity, Urine: 1.01 (ref 1.005–1.030)

## 2016-01-12 LAB — COMPREHENSIVE METABOLIC PANEL
ALBUMIN: 3.5 g/dL (ref 3.5–5.0)
ALT: 27 U/L (ref 17–63)
AST: 23 U/L (ref 15–41)
Alkaline Phosphatase: 68 U/L (ref 38–126)
Anion gap: 10 (ref 5–15)
BILIRUBIN TOTAL: 0.7 mg/dL (ref 0.3–1.2)
BUN: 7 mg/dL (ref 6–20)
CHLORIDE: 106 mmol/L (ref 101–111)
CO2: 24 mmol/L (ref 22–32)
CREATININE: 0.8 mg/dL (ref 0.61–1.24)
Calcium: 8.9 mg/dL (ref 8.9–10.3)
GFR calc Af Amer: >60 mL/min (ref >60)
GLUCOSE: 151 mg/dL — AB (ref 65–99)
POTASSIUM: 4.3 mmol/L (ref 3.5–5.1)
Sodium: 140 mmol/L (ref 135–145)
Total Protein: 6.1 g/dL — ABNORMAL LOW (ref 6.5–8.1)

## 2016-01-12 LAB — GLUCOSE, CAPILLARY: Glucose-Capillary: 162 mg/dL — ABNORMAL HIGH (ref 65–99)

## 2016-01-12 LAB — TROPONIN I

## 2016-01-12 LAB — D-DIMER, QUANTITATIVE: D-Dimer, Quant: 0.35 ug/mL-FEU (ref 0.00–0.50)

## 2016-01-12 LAB — CALCIUM: Calcium: 8.6 mg/dL — ABNORMAL LOW (ref 8.9–10.3)

## 2016-01-12 LAB — TSH: TSH: 3.222 u[IU]/mL (ref 0.350–4.500)

## 2016-01-12 LAB — MAGNESIUM: MAGNESIUM: 2 mg/dL (ref 1.7–2.4)

## 2016-01-12 LAB — PHOSPHORUS: PHOSPHORUS: 2.8 mg/dL (ref 2.5–4.6)

## 2016-01-12 MED ORDER — INSULIN ASPART 100 UNIT/ML ~~LOC~~ SOLN: 0.0000 [IU] | Freq: Every day | SUBCUTANEOUS | Status: DC

## 2016-01-12 MED ORDER — ENSURE ENLIVE PO LIQD
237.0000 mL | Freq: Two times a day (BID) | ORAL | Status: DC
  Administered 2016-01-15 – 2016-01-16 (×2): 237 mL via ORAL

## 2016-01-12 MED ORDER — EDOXABAN TOSYLATE 60 MG PO TABS
60.0000 mg | ORAL_TABLET | Freq: Every day | ORAL | Status: DC
  Administered 2016-01-13 – 2016-01-16 (×4): 60 mg via ORAL
  Filled 2016-01-12 (×7): qty 60

## 2016-01-12 MED ORDER — INSULIN ASPART 100 UNIT/ML ~~LOC~~ SOLN
0.0000 [IU] | Freq: Three times a day (TID) | SUBCUTANEOUS | Status: DC
  Administered 2016-01-15: 2 [IU] via SUBCUTANEOUS

## 2016-01-12 MED ORDER — LORAZEPAM 2 MG/ML IJ SOLN
2.0000 mg | INTRAMUSCULAR | Status: DC | PRN
Start: 2016-01-12 — End: 2016-01-16

## 2016-01-12 MED ORDER — ACETAMINOPHEN 650 MG RE SUPP: 650.0000 mg | Freq: Four times a day (QID) | RECTAL | Status: DC | PRN

## 2016-01-12 MED ORDER — ONDANSETRON HCL 4 MG PO TABS
4.0000 mg | ORAL_TABLET | Freq: Four times a day (QID) | ORAL | Status: DC | PRN
Start: 2016-01-12 — End: 2016-01-16

## 2016-01-12 MED ORDER — PANTOPRAZOLE SODIUM 40 MG PO TBEC
40.0000 mg | DELAYED_RELEASE_TABLET | Freq: Every day | ORAL | Status: DC
  Administered 2016-01-13 – 2016-01-16 (×4): 40 mg via ORAL
  Filled 2016-01-12 (×4): qty 1

## 2016-01-12 MED ORDER — ONDANSETRON HCL 4 MG/2ML IJ SOLN: 4.0000 mg | Freq: Four times a day (QID) | INTRAMUSCULAR | Status: DC | PRN

## 2016-01-12 MED ORDER — SODIUM CHLORIDE 0.9% FLUSH
3.0000 mL | Freq: Two times a day (BID) | INTRAVENOUS | Status: DC
  Administered 2016-01-12 – 2016-01-16 (×7): 3 mL via INTRAVENOUS

## 2016-01-12 MED ORDER — ACETAMINOPHEN 325 MG PO TABS: 650.0000 mg | ORAL_TABLET | Freq: Four times a day (QID) | ORAL | Status: DC | PRN

## 2016-01-12 NOTE — ED Notes (Signed)
Attempted report 

## 2016-01-12 NOTE — Progress Notes (Signed)
Richard MedinaDonald R Lins [id number] Admitted to 5W: 01/12/2016 7:09 PM Attending Provider: Ozella Rocksavid J Merrell, MD    Richard Lawson is a 82 y.o. male patient admitted from ED awake, alert  & orientated  X 3,  Prior, VSS - Blood pressure 118/70, pulse 70, temperature 97.9 F (36.6 C), temperature source Oral, resp. rate 18, SpO2 95 %., R/A, no c/o shortness of breath, no c/o chest pain, no distress noted. Tele # [id number] placed and pt is currently running: SR.   IV site WDL:  antecubital right, condition patent and no redness with a transparent dsg that's clean dry and intact.  Allergies:   Allergies  Allergen Reactions  . Metformin     Altered Mental Status     Past Medical History  Diagnosis Date  . Diabetes mellitus without complication (HCC)   . Retinitis pigmentosa of both eyes   . GERD (gastroesophageal reflux disease)   . Paroxysmal a-fib (HCC)   . Dementia     History:  obtained from the patient and wife.  Pt orientation to unit, room and routine. Information packet given to patient/family and safety video watched.  Admission INP armband ID verified with patient/family, and in place. SR up x 2, fall risk assessment complete with Patient and family verbalizing understanding of risks associated with falls. Pt verbalizes an understanding of how to use the call bell and to call for help before getting out of bed.  Skin, clean-dry- intact without evidence of bruising, or skin tears.   No evidence of skin break down noted on exam.    Will cont to monitor and assist as needed.  Joana ReamerJohnson, Fe Okubo C, RN 01/12/2016 7:09 PM

## 2016-01-12 NOTE — H&P (Signed)
[facility] History and Physical                                                                                    Richard Lawson, is a 82 y.o. male  MRN: [id number]   DOB - 01-May-1932  Admit Date - 01/12/2016  Outpatient Primary MD for the patient is  Richard Lope, MD  Referring MD: Patria Mane / ER  PMH: Past Medical History  Diagnosis Date  . Diabetes mellitus without complication (HCC)   . Retinitis pigmentosa of both eyes   . GERD (gastroesophageal reflux disease)   . Paroxysmal a-fib (HCC)   . Dementia       PSH: Past Surgical History  Procedure Laterality Date  . Shoulder surgery Right 2010     CC:  Chief Complaint  Patient presents with  . Altered Mental Status     HPI: 82 year old male patient with diabetes that is diet controlled, legally blind secondary to retinitis pigmentosa, GERD, paroxysmal atrial fibrillation on Edoxaban, dementia, and nocturnal hypoxemia requiring CPAP followed by neurology as an outpatient. Patient was sent to the ER after having a syncopal episode with collapse at home. Patient reported to his wife earlier this morning that he felt like he had an excessive amount of saliva in his mouth but denied nausea, abdominal pain, chest pressure breath or awareness of palpitations. The wife walked out of the room and later went to check on the patient found him unresponsive on the bed. EMS was called to the home and the patient remained unresponsive upon their arrival. Eventually the patient began to slowly wake up and according to the wife patient had returned to baseline. Patient did not have any tongue biting that had been incontinent of urine. Pulse ox was in the 90s per EMS.  ER Evaluation and treatment: Afebrile, BP 125/72, pulse 57 with a nadir of 51 after arrival to the ER, respirations 20, O2 saturations 99% on 3 L oxygen. EKG:, Sinus bradycardia ventricular rate 56 bpm, QTC 417 ms, incomplete right bundle branch block versus right axis  deviation, no ischemic changes. PCXR: No acute process noted CT head without contrast: No acute intracranial abnormality. Cerebral atrophy Laboratory data: Na 140, K 4.3, BUN 7, Cr 0.8, glucose 151, troponin less than 0.03, WBC 7400, hemoglobin 14.9 and platelets 248,000, d-dimer 0.35, urinalysis unremarkable except for 15 ketones  Review of Systems   In addition to the HPI above,  No Fever-chills, myalgias or other constitutional symptoms No Headache, changes with Vision or hearing, new weakness, tingling, numbness in any extremity, No problems swallowing food or Liquids, indigestion/reflux No Chest pain, Cough or Shortness of Breath, palpitations, orthopnea or DOE No Abdominal pain, N/V; no melena or hematochezia, no dark tarry stools, Bowel movements are regular, No dysuria, hematuria or flank pain No new skin rashes, lesions, masses or bruises, No new joints pains-aches No recent weight gain or loss No polyuria, polydypsia or polyphagia,  *A full 10 point Review of Systems was done, except as stated above, all other Review of Systems were negative.  Social History Social History  Substance Use Topics  . Smoking status: Never Smoker   .  Smokeless tobacco: Not on file  . Alcohol Use: No    Resides at: Private residence  Lives with: Spouse  Ambulatory status: With a cane   Family History Family History  Problem Relation Age of Onset  . Lung cancer Father   . COPD Brother      Prior to Admission medications   Medication Sig Start Date End Date Taking? Authorizing Provider  edoxaban (SAVAYSA) 60 MG TABS tablet Take 60 mg by mouth daily.    Historical Provider, MD  metoprolol tartrate (LOPRESSOR) 25 MG tablet Take 25 mg by mouth 2 (two) times daily.  01/17/15   Historical Provider, MD  omeprazole (PRILOSEC) 20 MG capsule Take 20 mg by mouth daily.    Historical Provider, MD    No Known Allergies  Physical Exam  Vitals  Blood pressure 118/69, pulse 56, temperature  97.7 F (36.5 C), temperature source Rectal, resp. rate 16, SpO2 91 %.   General:  In no acute distress, appears Stated age  Psych:  Normal affect, Denies Suicidal or Homicidal ideations, Awake Alert, Oriented X 3. Speech and thought patterns are clear and appropriate  Neuro:   No focal neurological deficits, CN II through XII intact, Strength 5/5 all 4 extremities, Sensation intact all 4 extremities.  ENT:  Ears and Eyes appear Normal, Conjunctivae clear, PER. Moist oral mucosa without erythema or exudates.  Neck:  Supple, No lymphadenopathy appreciated  Respiratory:  Symmetrical chest wall movement, Good air movement bilaterally, CTAB. Room Air  Cardiac:  RRR previous bradycardia has resolved, No Murmurs, no LE edema noted, no JVD, No carotid bruits, peripheral pulses palpable at 2+  Abdomen:  Positive bowel sounds, Soft, Non tender, Non distended,  No masses appreciated, no obvious hepatosplenomegaly  Skin:  No Cyanosis, Normal Skin Turgor, No Skin Rash or Bruise.  Extremities: Symmetrical without obvious trauma or injury,  no effusions.  Data Review  CBC  Recent Labs Lab 01/12/16 1110  WBC 7.4  HGB 14.9  HCT 43.9  PLT 248  MCV 90.7  MCH 30.8  MCHC 33.9  RDW 12.9  LYMPHSABS 1.6  MONOABS 0.7  EOSABS 0.2  BASOSABS 0.1    Chemistries   Recent Labs Lab 01/12/16 1110  NA 140  K 4.3  CL 106  CO2 24  GLUCOSE 151*  BUN 7  CREATININE 0.80  CALCIUM 8.9  AST 23  ALT 27  ALKPHOS 68  BILITOT 0.7    CrCl cannot be calculated (Unknown ideal weight.).  No results for input(s): TSH, T4TOTAL, T3FREE, THYROIDAB in the last 72 hours.  Invalid input(s): FREET3  Coagulation profile No results for input(s): INR, PROTIME in the last 168 hours.   Recent Labs  01/12/16 1440  DDIMER 0.35    Cardiac Enzymes  Recent Labs Lab 01/12/16 1110  TROPONINI <0.03    Invalid input(s): POCBNP  Urinalysis    Component Value Date/Time   COLORURINE YELLOW  01/12/2016 1228   APPEARANCEUR CLEAR 01/12/2016 1228   LABSPEC 1.010 01/12/2016 1228   PHURINE 7.5 01/12/2016 1228   GLUCOSEU NEGATIVE 01/12/2016 1228   HGBUR NEGATIVE 01/12/2016 1228   BILIRUBINUR NEGATIVE 01/12/2016 1228   KETONESUR 15* 01/12/2016 1228   PROTEINUR NEGATIVE 01/12/2016 1228   UROBILINOGEN 1.0 01/20/2015 1700   NITRITE NEGATIVE 01/12/2016 1228   LEUKOCYTESUR NEGATIVE 01/12/2016 1228    Imaging results:   Ct Head Wo Contrast  01/12/2016  CLINICAL DATA:  Unresponsive and decreased respirations. EXAM: CT HEAD WITHOUT CONTRAST TECHNIQUE: Contiguous axial images  were obtained from the base of the skull through the vertex without contrast. COMPARISON:  10/28/2009 FINDINGS: Right vertebral artery is heavily calcified. Cerebral atrophy is similar to the previous examination. No evidence for acute hemorrhage, mass lesion, midline shift, hydrocephalus or large infarct. Mucosal disease along the inferior right maxillary sinus. No calvarial fracture. IMPRESSION: No acute intracranial abnormality. Cerebral atrophy. Electronically Signed   By: Richarda OverlieAdam  Henn M.D.   On: 01/12/2016 13:59   Dg Chest Portable 1 View  01/12/2016  CLINICAL DATA:  Cough and sob since yesterday EXAM: PORTABLE CHEST 1 VIEW COMPARISON:  CT, 04/21/2015.  Chest radiograph, 11/21/2014. FINDINGS: Cardiac silhouette is normal in size. No mediastinal or hilar masses or evidence of adenopathy. Mild lung base atelectasis similar to the prior studies. No evidence of pneumonia or edema. No pleural effusion or pneumothorax. Bony thorax demineralized but grossly intact. IMPRESSION: Acute cardiopulmonary disease. Electronically Signed   By: Amie Portlandavid  Ormond M.D.   On: 01/12/2016 12:10     EKG: (Independently reviewed)Sinus bradycardia ventricular rate 56 bpm, QTC 417 ms, incomplete right bundle branch block versus right axis deviation, no ischemic changes.     Assessment & Plan  Principal Problem:   Syncope with collapse -Etiology  unclear but differential includes orthostasis versus seizure activity versus arrhythmia; patient with nocturnal hypoxemia and may have progressed to daytime hypoxemia that could've contributed to his syncopal event as well -Admit to telemetry/Obs -Echocardiogram -EEG -PT/OT evaluation -And ventilatory pulse oximetry on 3/4  Active Problems:   Paroxysmal atrial fibrillation/Bradycardia -Bradycardia has resolved but as precaution we'll hold Lopressor for now -Continue Edoxaban    DM w/o complication type II  -Diet controlled at home -Mildly hyperglycemic here so follow CBGs and provide SSI    OSA on CPAP -Continue nocturnal CPAP    Dementia -Not on medications prior to admission    Blindness    DVT Prophylaxis: Edoxaban  Family Communication: Wife was not at bedside at time of my interaction with patient    Code Status:  Full code based on prior documentation  Condition:  Stable  Discharge disposition: To spit discharge back to home environment 1 syncope workup completed  Time spent in minutes : 60      Ares Tegtmeyer L. ANP on 01/12/2016 at 3:29 PM  You may contact me by going to [email or web address] - password [email or web address]  I am available from 7a-7p but please confirm I am on the schedule by going to Amion as above.   After 7p please contact night coverage person covering me after hours  [facility]

## 2016-01-12 NOTE — ED Provider Notes (Signed)
CSN: [id number]     Arrival date & time 01/12/16  1054 History   First MD Initiated Contact with Patient 01/12/16 1123     Chief Complaint  Patient presents with  . Altered Mental Status   Level V caveat: Altered mental status/dementia  HPI Patient presents to the emergency department via EMS.  EMS was called the house for unresponsiveness.  The family reports that initially the patient began complaining of lots of water in his mouth and then the wife found him unresponsive on the bed.  She states they would not respond to voice or stimuli.  EMS was called.  EMS found him to be unresponsive as well but he did have a pulse and was found to be in sinus rhythm.  He was taken to the ambulance and his mental status gradually improved in route to the emergency department.  On arrival to the emergency department he is sitting up and spitting.  As reported that he is blind.  He stating he has lots of phlegm in his mouth.   Past Medical History  Diagnosis Date  . Diabetes mellitus without complication (HCC)   . Retinitis pigmentosa of both eyes   . GERD (gastroesophageal reflux disease)   . Paroxysmal a-fib (HCC)   . Dementia    Past Surgical History  Procedure Laterality Date  . Shoulder surgery Right 2010   Family History  Problem Relation Age of Onset  . Lung cancer Father   . COPD Brother    Social History  Substance Use Topics  . Smoking status: Never Smoker   . Smokeless tobacco: None  . Alcohol Use: No    Review of Systems  Unable to perform ROS: Dementia      Allergies  Review of patient's allergies indicates no known allergies.  Home Medications   Prior to Admission medications   Medication Sig Start Date End Date Taking? Authorizing Provider  edoxaban (SAVAYSA) 60 MG TABS tablet Take 60 mg by mouth daily.    Historical Provider, MD  metoprolol tartrate (LOPRESSOR) 25 MG tablet Take 25 mg by mouth 2 (two) times daily.  01/17/15   Historical Provider, MD  omeprazole  (PRILOSEC) 20 MG capsule Take 20 mg by mouth daily.    Historical Provider, MD   BP 118/69 mmHg  Pulse 56  Temp(Src) 97.7 F (36.5 C) (Rectal)  Resp 16  SpO2 91% Physical Exam  Constitutional: He appears well-developed and well-nourished.  HENT:  Head: Normocephalic and atraumatic.  Eyes: EOM are normal.  Neck: Normal range of motion.  Cardiovascular: Normal rate, regular rhythm, normal heart sounds and intact distal pulses.   Pulmonary/Chest: Effort normal and breath sounds normal. No respiratory distress.  Abdominal: Soft. He exhibits no distension. There is no tenderness.  Musculoskeletal: Normal range of motion.  Neurological: He is alert.  Follow simple commands.  Skin: Skin is warm and dry.  Psychiatric: He has a normal mood and affect. Judgment normal.  Nursing note and vitals reviewed.   ED Course  Procedures (including critical care time) Labs Review Labs Reviewed  COMPREHENSIVE METABOLIC PANEL - Abnormal; Notable for the following:    Glucose, Bld 151 (*)    Total Protein 6.1 (*)    All other components within normal limits  URINALYSIS, ROUTINE W REFLEX MICROSCOPIC (NOT AT [facility]) - Abnormal; Notable for the following:    Ketones, ur 15 (*)    All other components within normal limits  CBC WITH DIFFERENTIAL/PLATELET  TROPONIN I  Imaging Review Ct Head Wo Contrast  01/12/2016  CLINICAL DATA:  Unresponsive and decreased respirations. EXAM: CT HEAD WITHOUT CONTRAST TECHNIQUE: Contiguous axial images were obtained from the base of the skull through the vertex without contrast. COMPARISON:  10/28/2009 FINDINGS: Right vertebral artery is heavily calcified. Cerebral atrophy is similar to the previous examination. No evidence for acute hemorrhage, mass lesion, midline shift, hydrocephalus or large infarct. Mucosal disease along the inferior right maxillary sinus. No calvarial fracture. IMPRESSION: No acute intracranial abnormality. Cerebral atrophy. Electronically Signed    By: Richarda OverlieAdam  Henn M.D.   On: 01/12/2016 13:59   Dg Chest Portable 1 View  01/12/2016  CLINICAL DATA:  Cough and sob since yesterday EXAM: PORTABLE CHEST 1 VIEW COMPARISON:  CT, 04/21/2015.  Chest radiograph, 11/21/2014. FINDINGS: Cardiac silhouette is normal in size. No mediastinal or hilar masses or evidence of adenopathy. Mild lung base atelectasis similar to the prior studies. No evidence of pneumonia or edema. No pleural effusion or pneumothorax. Bony thorax demineralized but grossly intact. IMPRESSION: Acute cardiopulmonary disease. Electronically Signed   By: Amie Portlandavid  Ormond M.D.   On: 01/12/2016 12:10   I have personally reviewed and evaluated these images and lab results as part of my medical decision-making.   EKG Interpretation   Date/Time:  Friday January 12 2016 14:07:41 EST Ventricular Rate:  56 PR Interval:  119 QRS Duration: 111 QT Interval:  432 QTC Calculation: 417 R Axis:   -61 Text Interpretation:  Sinus rhythm Borderline short PR interval LAD,  consider left anterior fascicular block Abnormal R-wave progression, early  transition Consider anterior infarct No significant change was found  Confirmed by Issacc Merlo  MD, Caryn BeeKEVIN ([id number]) on 01/12/2016 2:28:23 PM      MDM   Final diagnoses:  None    Throughout the patient's emergency department stay his mental status has continued to improve.  He is now back to baseline mental status.  Labs, chest x-ray, head CT without abnormality.  EKG demonstrates no abnormalities.  He did have incontinence in the bed and this could represent seizure with postictal state which is now resolved.  No prior history of seizures.  He does have dementia and underwent MRI on 12/21/2015 at [facility] which shows no acute abnormalities and no prior infarcts noted.  Patient is now returned baseline mental status.  He'll need to be observed in the hospital overnight.  He remains on telemetry at this time.  This could've represented seizure with postictal state  versus arrhythmia with severe global hypoperfusion that now has resolved.    Azalia BilisKevin Katrell Milhorn, MD 01/12/16 [phone]

## 2016-01-12 NOTE — ED Notes (Signed)
Per EMS- pt was last seen normal by wife at 530am. Pt was found at 10am and was unresponsive and decreased respirations. Pt was only resoinsive to painful with EMS. Pt has since become responsive upon arrival. Pt reported to have generalized weakness and drooling. Pt denies pain. Hx of dementia. VSS with EMS.

## 2016-01-12 NOTE — ED Notes (Signed)
Per family pt is at baseline.

## 2016-01-13 DIAGNOSIS — R55 Syncope and collapse: Secondary | ICD-10-CM | POA: Diagnosis not present

## 2016-01-13 DIAGNOSIS — R4182 Altered mental status, unspecified: Secondary | ICD-10-CM | POA: Insufficient documentation

## 2016-01-13 DIAGNOSIS — I459 Conduction disorder, unspecified: Secondary | ICD-10-CM | POA: Diagnosis not present

## 2016-01-13 DIAGNOSIS — R001 Bradycardia, unspecified: Secondary | ICD-10-CM | POA: Diagnosis not present

## 2016-01-13 DIAGNOSIS — H540 Blindness, both eyes: Secondary | ICD-10-CM | POA: Diagnosis not present

## 2016-01-13 LAB — HEMOGLOBIN A1C
HEMOGLOBIN A1C: 6.9 % — AB (ref 4.8–5.6)
Mean Plasma Glucose: 151 mg/dL

## 2016-01-13 LAB — CBC
HEMATOCRIT: 45.3 % (ref 39.0–52.0)
HEMOGLOBIN: 14.9 g/dL (ref 13.0–17.0)
MCH: 30 pg (ref 26.0–34.0)
MCHC: 32.9 g/dL (ref 30.0–36.0)
MCV: 91.1 fL (ref 78.0–100.0)
Platelets: 253 10*3/uL (ref 150–400)
RBC: 4.97 MIL/uL (ref 4.22–5.81)
RDW: 13.1 % (ref 11.5–15.5)
WBC: 8.7 10*3/uL (ref 4.0–10.5)

## 2016-01-13 LAB — BASIC METABOLIC PANEL
ANION GAP: 9 (ref 5–15)
BUN: 7 mg/dL (ref 6–20)
CO2: 23 mmol/L (ref 22–32)
Calcium: 8.9 mg/dL (ref 8.9–10.3)
Chloride: 107 mmol/L (ref 101–111)
Creatinine, Ser: 0.76 mg/dL (ref 0.61–1.24)
GFR calc Af Amer: >60 mL/min (ref >60)
Glucose, Bld: 129 mg/dL — ABNORMAL HIGH (ref 65–99)
POTASSIUM: 3.9 mmol/L (ref 3.5–5.1)
SODIUM: 139 mmol/L (ref 135–145)

## 2016-01-13 LAB — GLUCOSE, CAPILLARY
GLUCOSE-CAPILLARY: 122 mg/dL — AB (ref 65–99)
GLUCOSE-CAPILLARY: 121 mg/dL — AB (ref 65–99)
Glucose-Capillary: 128 mg/dL — ABNORMAL HIGH (ref 65–99)
Glucose-Capillary: 167 mg/dL — ABNORMAL HIGH (ref 65–99)

## 2016-01-13 MED ORDER — GLUCERNA SHAKE PO LIQD
237.0000 mL | Freq: Two times a day (BID) | ORAL | Status: DC
  Administered 2016-01-14 – 2016-01-16 (×4): 237 mL via ORAL

## 2016-01-13 NOTE — Evaluation (Signed)
Occupational Therapy Evaluation Patient Details Name: Richard MedinaDonald R Lawson MRN: [id number] DOB: 12/15/1931 Today's Date: 01/13/2016    History of Present Illness 82 y.o. found unresponsive at home. PMH includes DM, GERD, dementia, paroxysmal Afib, retinitis pigmentosa of both eyes, Rt shoulder surgery.   Clinical Impression   Pt admitted with above. Wife provided set up assist some with dressing, PTA and wife performed cooking and cleaning. Feel pt will benefit from acute OT to increase independence and strength prior to d/c.     Follow Up Recommendations  No OT follow up;Supervision/Assistance - 24 hour    Equipment Recommendations   (tub/shower seat versus 3 in 1 beside commode)    Recommendations for Other Services       Precautions / Restrictions Precautions Precautions: Fall;Other (comment) (pt blind) Restrictions Weight Bearing Restrictions: No      Mobility Bed Mobility               General bed mobility comments: not assessed  Transfers Overall transfer level: Needs assistance   Transfers: Sit to/from Stand Sit to Stand: Min guard              Balance      Assist for balance with ambulation.                                      ADL Overall ADL's : Needs assistance/impaired                     Lower Body Dressing: Minimal assistance;Sit to/from stand   Toilet Transfer: Moderate assistance;Ambulation (sit to stand from chair-Min guard; Mod assist-ambulation)           Functional mobility during ADLs: Moderate assistance   Comments: Talked with pt and family member about d/c recommendations.       Vision  Pt blind.   Perception     Praxis      Pertinent Vitals/Pain Pain Assessment: Faces Faces Pain Scale: No hurt     Hand Dominance     Extremity/Trunk Assessment Upper Extremity Assessment Upper Extremity Assessment: Generalized weakness   Lower Extremity Assessment Lower Extremity Assessment: Defer  to PT evaluation       Communication Communication Communication: No difficulties   Cognition Arousal/Alertness: Awake/alert Behavior During Therapy: WFL for tasks assessed/performed Overall Cognitive Status: Within Functional Limits for tasks assessed-history of dementia                      General Comments       Exercises       Shoulder Instructions      Home Living Family/patient expects to be discharged to:: Private residence Living Arrangements: Spouse/significant other Available Help at Discharge: Family (sounds like she is there a lot)               Bathroom Shower/Tub: Producer, television/film/videoWalk-in shower   Bathroom Toilet: Standard         Additional Comments: please refer to PT eval for more home setup information.      Prior Functioning/Environment Level of Independence: Needs assistance  Gait / Transfers Assistance Needed: using a cane at times ADL's / Homemaking Assistance Needed: set up assist some for dressing, wife performs cooking and cleaning        OT Diagnosis: Generalized weakness   OT Problem List: Decreased strength;Impaired balance (sitting and/or standing);Decreased activity tolerance;Impaired vision/perception;Decreased knowledge  of precautions;Decreased knowledge of use of DME or AE;Decreased cognition   OT Treatment/Interventions: DME and/or AE instruction;Therapeutic activities;Patient/family education;Balance training;Cognitive remediation/compensation;Visual/perceptual remediation/compensation;Self-care/ADL training;Therapeutic exercise;Energy conservation    OT Goals(Current goals can be found in the care plan section) Acute Rehab OT Goals Patient Stated Goal: not stated OT Goal Formulation: With patient Time For Goal Achievement: 01/20/16 Potential to Achieve Goals: Good ADL Goals Pt Will Perform Lower Body Dressing: with supervision;sit to/from stand;with set-up Pt Will Transfer to Toilet: ambulating;with supervision;with  set-up;regular height toilet Additional ADL Goal #1: Pt will perform HEP for bilateral UEs with set up assist to increase strength.  OT Frequency: Min 2X/week   Barriers to D/C:            Co-evaluation              End of Session Equipment Utilized During Treatment: Gait belt  Activity Tolerance: Patient tolerated treatment well Patient left: in chair;with call bell/phone within reach;with family/visitor present   Time: 1552-1610 OT Time Calculation (min): 18 min Charges:  OT General Charges $OT Visit: 1 Procedure OT Evaluation $OT Eval Moderate Complexity: 1 Procedure G-Codes: OT G-codes **NOT FOR INPATIENT CLASS** Functional Assessment Tool Used: clinical judgment Functional Limitation: Self care Self Care Current Status (R6045): At least 20 percent but less than 40 percent impaired, limited or restricted Self Care Goal Status (W0981): At least 1 percent but less than 20 percent impaired, limited or restricted  Earlie Raveling OTR/L [phone] 01/13/2016, 5:01 PM

## 2016-01-13 NOTE — Progress Notes (Signed)
OT Cancellation Note  Patient Details Name: Richard Lawson MRN: [id number] DOB: 01-Jun-1932   Cancelled Treatment:    Reason Eval/Treat Not Completed:  (PT in with pt.)  Earlie RavelingStraub, Saphyra Hutt L OTR/L [phone] 01/13/2016, 3:33 PM

## 2016-01-13 NOTE — Progress Notes (Addendum)
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 Patient was sent to the ER after having a syncopal episode with collapse at home. Patient reported to his wife earlier this morning that he felt like he had an excessive amount of saliva in his mouth but denied nausea, abdominal pain, chest pressure breath or awareness of palpitations. The wife walked out of the room and later went to check on the patient found him unresponsive on the bed. EMS was called to the home and the patient remained unresponsive upon their arrival. Eventually the patient began to slowly wake up and according to the wife patient had returned to baseline. Patient did not have any tongue biting that had been incontinent of urine. Pulse ox was in the 90s per EMS.  EKG:, Sinus bradycardia ventricular rate 56 bpm, QTC 417 ms, incomplete right bundle branch block versus right axis deviation, no ischemic changes.  Assessment and plan   Syncope with collapse -Etiology unclear but differential includes orthostasis versus seizure activity versus arrhythmia; patient with nocturnal hypoxemia 9has sleep apnea and is noncompliant )   Cont  telemetry/Obs -Echocardiogram,. Last echo in 2015 -EEG -PT/OT evaluation -And ventilatory pulse oximetry on 3/4 Recent MRI  2/9 was negative , CT of the head  negative Discussed with Dr Jacinto Halim, via telephone, hold off on official consult for now, he will be setup with an event monitor on Monday    Paroxysmal atrial fibrillation/Bradycardia -Bradycardia has resolved but as precaution we'll hold Lopressor for now -Continue Edoxaban Held beta blocker , recently also started on aricept by neurologist in [place]   DM w/o complication type II  -Diet controlled at home -Mildly hyperglycemic here so follow CBGs and provide SSI   OSA on CPAP -Continue nocturnal CPAP   Dementia According to the wife the patient was recently started on Aricept   Blindness     DVT prophylaxsis edoxaban   Code Status:      Code Status Orders        Start     Ordered   01/12/16 2012  Full code   Continuous     01/12/16 2011    Code Status History    Date Active Date Inactive Code Status Order ID Comments User Context   01/21/2015 12:11 AM 01/24/2015  3:38 PM Full Code [id number]  Therisa Doyne, MD Inpatient      Family Communication: Discussed in detail with the patient and his wife, all imaging results, lab results explained to the patient   Disposition Plan:  Anticipate discharge tomorrow     Consultants:  Cardiology  Procedures:  None  Antibiotics: Anti-infectives    None         HPI/Subjective: No recurrent syncopal episodes since admission, telemetry shows normal sinus rhythm with right bundle branch block, heart rate in the 60s to 70s  Objective:  Filed Vitals:   01/12/16 1715 01/12/16 1852 01/12/16 2113 01/13/16 0727  BP: 122/82 118/70 135/81 135/74  Pulse: 78 70 67 69  Temp:  97.9 F (36.6 C) 98.3 F (36.8 C) 97.4 F (36.3 C)  TempSrc:  Oral Oral Oral  Resp: Weight:    83.689 kg (184 lb 8 oz)  SpO2: 94% 95%  95%   No intake or output data in the 24 hours ending 01/13/16 1055  Exam:  General: No acute respiratory distress Lungs: Clear to auscultation bilaterally without wheezes or  crackles Cardiovascular: Regular rate and rhythm without murmur gallop or rub normal S1 and S2 Abdomen: Nontender, nondistended, soft, bowel sounds positive, no rebound, no ascites, no appreciable mass Extremities: No significant cyanosis, clubbing, or edema bilateral lower extremities     Data Review   Micro Results No results found for this or any previous visit (from the past 240 hour(s)).  Radiology Reports Ct Head Wo Contrast  01/12/2016  CLINICAL DATA:  Unresponsive and decreased respirations. EXAM: CT HEAD WITHOUT CONTRAST TECHNIQUE: Contiguous axial images were obtained from the base of the skull through the vertex without contrast. COMPARISON:  10/28/2009 FINDINGS: Right vertebral artery is heavily calcified. Cerebral atrophy is similar to the previous examination. No evidence for acute hemorrhage, mass lesion, midline shift, hydrocephalus or large infarct. Mucosal disease along the inferior right maxillary sinus. No calvarial fracture. IMPRESSION: No acute intracranial abnormality. Cerebral atrophy. Electronically Signed   By: [name] M.D.   On: 01/12/2016 13:59   Dg Chest Portable 1 View  01/12/2016  CLINICAL DATA:  Cough and sob since yesterday EXAM: PORTABLE CHEST 1 VIEW COMPARISON:  CT, 04/21/2015.  Chest radiograph, 11/21/2014. FINDINGS: Cardiac silhouette is normal in size. No mediastinal or hilar masses or evidence of adenopathy. Mild lung base atelectasis similar to the prior studies. No evidence of pneumonia or edema. No pleural effusion or pneumothorax. Bony thorax demineralized but grossly intact. IMPRESSION: Acute cardiopulmonary disease. Electronically Signed   By: Amie Portland M.D.   On: 01/12/2016 12:10     CBC  Recent Labs Lab 01/12/16 1110 01/13/16 0652  WBC 7.4 8.7  HGB 14.9 14.9  HCT 43.9 45.3  PLT 248 253  MCV 90.7 91.1  MCH 30.8 30.0  MCHC 33.9 32.9  RDW 12.9 13.1  LYMPHSABS 1.6  --   MONOABS 0.7  --   EOSABS 0.2  --   BASOSABS 0.1  --      Chemistries   Recent Labs Lab 01/12/16 1110 01/12/16 2114 01/13/16 0652  NA 140  --  139  K 4.3  --  3.9  CL 106  --  107  CO2 24  --  23  GLUCOSE 151*  --  129*  BUN 7  --  7  CREATININE 0.80  --  0.76  CALCIUM 8.9 8.6* 8.9  MG  --  2.0  --   AST 23  --   --   ALT 27  --   --   ALKPHOS 68  --   --   BILITOT 0.7  --   --    ------------------------------------------------------------------------------------------------------------------ estimated creatinine clearance is 76.3 mL/min (by C-G formula based on Cr of 0.76). ------------------------------------------------------------------------------------------------------------------  Recent Labs  01/12/16 1558  HGBA1C 6.9*   ------------------------------------------------------------------------------------------------------------------ No results for input(s): CHOL, HDL, LDLCALC, TRIG, CHOLHDL, LDLDIRECT in the last 72 hours. ------------------------------------------------------------------------------------------------------------------  Recent Labs  01/12/16 2114  TSH 3.222   ------------------------------------------------------------------------------------------------------------------ No results for  input(s): VITAMINB12, FOLATE, FERRITIN, TIBC, IRON, RETICCTPCT in the last 72 hours.  Coagulation profile No results for input(s): INR, PROTIME in the last 168 hours.   Recent Labs  01/12/16 1440  DDIMER 0.35    Cardiac Enzymes  Recent Labs Lab 01/12/16 1110  TROPONINI <0.03   ------------------------------------------------------------------------------------------------------------------ Invalid input(s): POCBNP   CBG:  Recent Labs Lab 01/12/16 2043 01/13/16 0805  GLUCAP 162* 128*       Studies: Ct Head Wo Contrast  01/12/2016  CLINICAL DATA:  Unresponsive and decreased respirations. EXAM: CT HEAD WITHOUT CONTRAST TECHNIQUE: Contiguous axial images were obtained from the base of  the skull through the vertex without contrast. COMPARISON:  10/28/2009 FINDINGS: Right vertebral artery is heavily calcified. Cerebral atrophy is similar to the previous examination. No evidence for acute hemorrhage, mass lesion, midline shift, hydrocephalus or large infarct. Mucosal disease along the inferior right maxillary sinus. No calvarial fracture. IMPRESSION: No acute intracranial abnormality. Cerebral atrophy. Electronically Signed   By: Richarda OverlieAdam  Henn M.D.   On: 01/12/2016 13:59   Dg Chest Portable 1 View  01/12/2016  CLINICAL DATA:  Cough and sob since yesterday EXAM: PORTABLE CHEST 1 VIEW COMPARISON:  CT, 04/21/2015.  Chest radiograph, 11/21/2014. FINDINGS: Cardiac silhouette is normal in size. No mediastinal or hilar masses or evidence of adenopathy. Mild lung base atelectasis similar to the prior studies. No evidence of pneumonia or edema. No pleural effusion or pneumothorax. Bony thorax demineralized but grossly intact. IMPRESSION: Acute cardiopulmonary disease. Electronically Signed   By: Amie Portlandavid  Ormond M.D.   On: 01/12/2016 12:10      Lab Results  Component Value Date   HGBA1C 6.9* 01/12/2016   HGBA1C 7.1* 01/20/2015   Lab Results  Component Value Date   CREATININE 0.76 01/13/2016       Scheduled Meds: . edoxaban  60 mg Oral Daily  . feeding supplement (ENSURE ENLIVE)  237 mL Oral BID BM  . insulin aspart  0-5 Units Subcutaneous QHS  . insulin aspart  0-9 Units Subcutaneous TID WC  . pantoprazole  40 mg Oral Daily  . sodium chloride flush  3 mL Intravenous Q12H   Continuous Infusions:   Principal Problem:   Syncope Active Problems:   Paroxysmal atrial fibrillation (HCC)   DM w/o complication type II (HCC)   Obstructive sleep apnea syndrome   OSA on CPAP   Blindness   Bradycardia   Dementia   Syncope and collapse   Altered mental status    Time spent: 45 minutes   May[facility] Digestive Health Center LLCBROL,Carlson Belland  [facility] Pager [phone]. If 7PM-7AM, please contact night-coverage at  [email or web address], password [email or web address] [email or web address] 01/13/2016, 10:55 AM

## 2016-01-13 NOTE — Progress Notes (Signed)
Physical Therapy Treatment Patient Details Name: Richard Lawson MRN: [id number] DOB: 03/09/1932 Today's Date: 01/13/2016    History of Present Illness Patient is an 82 yo male admitted 01/12/16 after syncopal episode at home.    PMH:  DM, PAF, dementia, RP both eyes - legally blind, OSA on CPAP, Rt shoulder surgery    PT Comments    Patient continues to have unsteady gait, though improved with hand-hold assist.  Continue to recommend HHPT for home evaluation and continued therapy at discharge.  Follow Up Recommendations  Home health PT;Supervision/Assistance - 24 hour     Equipment Recommendations  None recommended by PT    Recommendations for Other Services       Precautions / Restrictions Precautions Precautions: Fall Precaution Comments: Patient is blind - sees no light/shadows.  Patient has had several falls at home due to blindness/stairs. Restrictions Weight Bearing Restrictions: No    Mobility  Bed Mobility   General bed mobility comments: Patient in chair  Transfers Overall transfer level: Needs assistance Equipment used: None;1 person hand held assist Transfers: Sit to/from Stand Sit to Stand: Min assist         General transfer comment: Assist to steady during transfers on recliner and toilet.  Verbal and tactile cues for location of grab bars in bathroom.  Required assist for pericare following bm.  Ambulation/Gait Ambulation/Gait assistance: Min assist Ambulation Distance (Feet): 40 Feet Assistive device: Straight cane;1 person hand held assist Gait Pattern/deviations: Step-through pattern;Decreased step length - right;Decreased step length - left;Decreased stride length;Shuffle;Trunk flexed Gait velocity: decreased Gait velocity interpretation: Below normal speed for age/gender General Gait Details: Patient using cane in RUE and hand-hold assist in LUE.  Verbal cueing for direction and getting around obstacles.  On second gait attempt, used only  hand-hold assist and patient able to move with more ease.  Continued to require min assist for balance.  Gait remained unsteady.   Stairs            Wheelchair Mobility    Modified Rankin (Stroke Patients Only)       Balance Overall balance assessment: Needs assistance;History of Falls         Standing balance support: Single extremity supported Standing balance-Leahy Scale: Fair                      Cognition Arousal/Alertness: Awake/alert Behavior During Therapy: WFL for tasks assessed/performed Overall Cognitive Status: Within Functional Limits for tasks assessed (History of dementia)                      Exercises      General Comments        Pertinent Vitals/Pain Pain Assessment: No/denies pain Faces Pain Scale: No hurt    Home Living Family/patient expects to be discharged to:: Private residence Living Arrangements: Spouse/significant other Available Help at Discharge: Family (sounds like she is there a lot) Type of Home: House Home Access: Stairs to enter Entrance Stairs-Rails: Right;Left Home Layout: One level Home Equipment: Cane - single point Additional Comments: please refer to PT eval for more home setup information.    Prior Function Level of Independence: Needs assistance  Gait / Transfers Assistance Needed: using a cane at times ADL's / Homemaking Assistance Needed: set up assist some for dressing, wife performs cooking and cleaning      PT Goals (current goals can now be found in the care plan section) Acute Rehab PT Goals Patient Stated Goal:  not stated PT Goal Formulation: With patient/family Time For Goal Achievement: 01/20/16 Potential to Achieve Goals: Good Progress towards PT goals: Progressing toward goals    Frequency  Min 3X/week    PT Plan Current plan remains appropriate    Co-evaluation             End of Session Equipment Utilized During Treatment: Gait belt;Oxygen Activity Tolerance:  Patient tolerated treatment well;Patient limited by fatigue Patient left: in chair;with call bell/phone within reach;with family/visitor present     Time: [id number] PT Time Calculation (min) (ACUTE ONLY): 29 min  Charges:  $Gait Training: 23-37 mins                    G Codes:   Vena AustriaDavis, Richard Lawson 01/13/2016, 7:30 PM Durenda HurtSusan Lawson. Renaldo Lawson, PT, [facility] Pager [phone]

## 2016-01-13 NOTE — Procedures (Signed)
Pt does not wish to wear cpap at this time, will inform RT if anything changes.

## 2016-01-13 NOTE — Progress Notes (Addendum)
Physical Therapy Evaluation  Clinical impression:  Patient presents with problems listed below.  Will benefit from acute PT to maximize functional mobility prior to discharge home with wife.  Recommend f/u HHPT at discharge for continued therapy.  PT Recommendation  Follow Up Recommendations Home health PT;Supervision/Assistance - 24 hour  PT equipment None recommended by PT          01/13/16 1551  PT Visit Information  Last PT Received On 01/13/16  Assistance Needed +1  History of Present Illness Patient is an 82 yo male admitted 01/12/16 after syncopal episode at home.    PMH:  DM, PAF, dementia, RP both eyes - legally blind, OSA on CPAP, Rt shoulder surgery  Precautions  Precautions Fall  Precaution Comments Patient is blind - sees no light/shadows.  Patient has had several falls at home due to blindness/stairs.  Restrictions  Weight Bearing Restrictions No  Home Living  Family/patient expects to be discharged to: Private residence  Living Arrangements Spouse/significant other  Available Help at Discharge Family;Available 24 hours/day  Type of Home House  Home Access Stairs to enter  Entrance Stairs-Number of Steps 15 at garage entrance  Entrance Stairs-Rails Right;Left  Home Layout One level  Bathroom Shower/Tub Walk-in Audiological scientistshower  Bathroom Toilet Standard  Home Equipment Cane - single point  Prior Function  Level of Independence Needs assistance  Gait / Transfers Assistance Needed Uses cane and furniture to maneuver around house.  Holds on to wife/others with him when out of house  ADL's / Homemaking Assistance Needed Set up for bathing/dressing.  Wife performs meal prep, cleaning, and yard work  Geneticist, molecularCommunication  Communication No difficulties  Pain Assessment  Pain Assessment No/denies pain  Cognition  Arousal/Alertness Awake/alert  Behavior During Therapy WFL for tasks assessed/performed  Overall Cognitive Status Within Functional Limits for tasks assessed (History of  dementia.)  Upper Extremity Assessment  Upper Extremity Assessment Defer to OT evaluation  Lower Extremity Assessment  Lower Extremity Assessment Generalized weakness  Cervical / Trunk Assessment  Cervical / Trunk Assessment Kyphotic  Bed Mobility  Overal bed mobility Needs Assistance  Bed Mobility Supine to Sit  Supine to sit Min assist  General bed mobility comments Verbal cues to move to EOB.  Patient able to move with min guard assist initially.  Required min assist to scoot out to EOB and get feet on floor.  Good sitting balance once upright.  Transfers  Overall transfer level Needs assistance  Equipment used Rolling walker (2 wheeled)  Transfers Sit to/from Stand  Sit to Stand Min assist  General transfer comment Assist to steady during transfer and to place hands on RW.  Ambulation/Gait  Ambulation/Gait assistance Min assist;Mod assist  Ambulation Distance (Feet) 70 Feet  Assistive device Rolling walker (2 wheeled)  Gait Pattern/deviations Step-through pattern;Decreased stride length;Decreased step length - right;Decreased step length - left;Shuffle;Trunk flexed;Drifts right/left  General Gait Details Use of RW due to unfamiliar surroundings.  Patient required assist to maneuver RW - drifting to Lt.  Provided max verbal cues for direction and surroundings.  Noted flexed posture and short, shuffling steps.    Gait velocity decreased  Gait velocity interpretation Below normal speed for age/gender  Balance  Overall balance assessment Needs assistance;History of Falls  Standing balance support Single extremity supported  Standing balance-Leahy Scale Fair  PT - End of Session  Equipment Utilized During Treatment Gait belt;Oxygen  Activity Tolerance Patient tolerated treatment well;Patient limited by fatigue  Patient left in chair;with call bell/phone within reach;with  family/visitor present  Nurse Communication Mobility status  PT Assessment  PT Therapy Diagnosis  Abnormality of  gait;Difficulty walking;Generalized weakness  PT Recommendation/Assessment Patient needs continued PT services  PT Problem List Decreased strength;Decreased activity tolerance;Decreased balance;Decreased mobility;Decreased knowledge of use of DME;Cardiopulmonary status limiting activity  PT Plan  PT Frequency (ACUTE ONLY) Min 3X/week  PT Treatment/Interventions (ACUTE ONLY) DME instruction;Gait training;Stair training;Functional mobility training;Therapeutic activities;Balance training;Patient/family education  PT Recommendation  Follow Up Recommendations Home health PT;Supervision/Assistance - 24 hour  PT equipment None recommended by PT  Individuals Consulted  Consulted and Agree with Results and Recommendations Patient;Family member/caregiver  Family Member Consulted Son, daughter-in-law  Acute Rehab PT Goals  Patient Stated Goal To return home  PT Goal Formulation With patient/family  Time For Goal Achievement 01/20/16  Potential to Achieve Goals Good  PT Time Calculation  PT Start Time (ACUTE ONLY) 1526  PT Stop Time (ACUTE ONLY) 1549  PT Time Calculation (min) (ACUTE ONLY) 23 min  PT G-Codes **NOT FOR INPATIENT CLASS**  Functional Assessment Tool Used Clinical judgement  Functional Limitation Mobility: Walking and moving around  Mobility: Walking and Moving Around Current Status (Z6109) CK  Mobility: Walking and Moving Around Goal Status (U0454) CI  PT General Charges  $$ ACUTE PT VISIT 1 Procedure  PT Evaluation  $PT Eval Moderate Complexity 1 Procedure  PT Treatments  $Gait Training 8-22 mins  Durenda Hurt. Renaldo Fiddler, [facility] Acute Rehab Services Pager [phone]

## 2016-01-13 NOTE — Progress Notes (Signed)
Initial Nutrition Assessment  DOCUMENTATION CODES:  Severe malnutrition in context of chronic illness  INTERVENTION:  Glucerna Shake po BID, each supplement provides 220 kcal and 10 grams of protein  RD gave diabetic diet recommendations as well as suggestions on ways to help patient maintain bodyweight in view of declining appeite  NUTRITION DIAGNOSIS:  Malnutrition related to lethargy/confusion (dementia), poor appetite as evidenced by an estimated energy intake  Of < or equal to 75% needs for > or equal to 1 month and loss of ~10% bw  In < 3 months  GOAL:  Patient will meet greater than or equal to 90% of their needs  MONITOR:  PO intake, Supplement acceptance, Labs, Skin  REASON FOR ASSESSMENT:  Malnutrition Screening Tool    ASSESSMENT:  82 y/o male PMHx DM (diet controlled), blindess, gerd, afib, dementia who presents after episode of syncope and collapse.   Per wife report, pt has had a noticeable decrease in intake in the past couple months. This seems to coincide with reported decrease in mentation/dementia. Pt also has a function disability relating to his shoulder that makes eating with utensils difficult. He relies on finger foods when eating in public.   Wife reports she checks patients cbgs 1x daily. Every morning and they are typically 136-142 mg/dl. The pt/wife are very active and workout 4-5x a week. This undoubtedly helps with his glycemic control.   Wife weighs pt 1 time each week at the same time of the day, with the same amount of clothes on. She confirms the 20 lb wt loss in 2.5 months   She had many questions about the DM diet, how she was doing and about meal/snack choices. RD spent considerable amount of time discussing her eating habits and made multiple suggestions. Also addressed ways wife could help pt maintain weight in presence of declining appetite ie high kcal foods/beverages.  NFPE: WDL  Labs reviewed: Slightly hyperglycemic   Diet Order:  Diet  Heart Room service appropriate?: Yes; Fluid consistency:: Thin  Skin:  Reviewed, no issues  Last BM:  3/3  Height:  Ht Readings from Last 1 Encounters:  10/26/15 5\' 8"  (1.727 m)   Weight:  Wt Readings from Last 1 Encounters:  01/13/16 184 lb 8 oz (83.689 kg)   Wt Readings from Last 10 Encounters:  01/13/16 184 lb 8 oz (83.689 kg)  10/26/15 204 lb (92.534 kg)  04/19/15 207 lb (93.895 kg)  02/20/15 211 lb (95.709 kg)  01/21/15 202 lb (91.627 kg)  12/23/14 211 lb (95.709 kg)   Ideal Body Weight:  70 kg  BMI:  Body mass index is 28.06 kg/(m^2).  Estimated Nutritional Needs:  Kcal:  1700-1900 (20-23 kcal/kg bw) Protein:  70-84 g Pro (1-1.2 g/kg ibw) Fluid:  1.7-1.9 liters fluid  EDUCATION NEEDS:  Education needs addressed  Christophe LouisNathan Tauriel Scronce RD, LDN Nutrition Pager: [phone] 01/13/2016 6:53 PM

## 2016-01-14 ENCOUNTER — Observation Stay (HOSPITAL_COMMUNITY): Payer: Medicare Other

## 2016-01-14 ENCOUNTER — Other Ambulatory Visit (HOSPITAL_COMMUNITY): Payer: Medicare Other

## 2016-01-14 DIAGNOSIS — E43 Unspecified severe protein-calorie malnutrition: Secondary | ICD-10-CM | POA: Insufficient documentation

## 2016-01-14 DIAGNOSIS — R55 Syncope and collapse: Secondary | ICD-10-CM | POA: Diagnosis not present

## 2016-01-14 DIAGNOSIS — R001 Bradycardia, unspecified: Secondary | ICD-10-CM | POA: Diagnosis not present

## 2016-01-14 DIAGNOSIS — R400 Somnolence: Secondary | ICD-10-CM | POA: Diagnosis not present

## 2016-01-14 DIAGNOSIS — R4182 Altered mental status, unspecified: Secondary | ICD-10-CM | POA: Diagnosis not present

## 2016-01-14 LAB — GLUCOSE, CAPILLARY
GLUCOSE-CAPILLARY: 124 mg/dL — AB (ref 65–99)
Glucose-Capillary: 180 mg/dL — ABNORMAL HIGH (ref 65–99)
Glucose-Capillary: 148 mg/dL — ABNORMAL HIGH (ref 65–99)
Glucose-Capillary: 198 mg/dL — ABNORMAL HIGH (ref 65–99)

## 2016-01-14 MED ORDER — METOPROLOL TARTRATE 25 MG PO TABS
25.0000 mg | ORAL_TABLET | Freq: Two times a day (BID) | ORAL | Status: DC
  Administered 2016-01-14 – 2016-01-15 (×2): 25 mg via ORAL
  Filled 2016-01-14 (×2): qty 1

## 2016-01-14 MED ORDER — METOPROLOL TARTRATE 1 MG/ML IV SOLN
INTRAVENOUS | Status: AC
  Filled 2016-01-14: qty 5

## 2016-01-14 MED ORDER — LEVALBUTEROL HCL 1.25 MG/0.5ML IN NEBU: 1.2500 mg | INHALATION_SOLUTION | Freq: Four times a day (QID) | RESPIRATORY_TRACT | Status: DC | PRN

## 2016-01-14 MED ORDER — PERFLUTREN LIPID MICROSPHERE
1.0000 mL | INTRAVENOUS | Status: AC | PRN
  Administered 2016-01-14: 2 mL via INTRAVENOUS
  Filled 2016-01-14: qty 10

## 2016-01-14 MED ORDER — FUROSEMIDE 10 MG/ML IJ SOLN
40.0000 mg | Freq: Once | INTRAMUSCULAR | Status: AC
Start: 2016-01-14 — End: 2016-01-14
  Administered 2016-01-14: 40 mg via INTRAVENOUS
  Filled 2016-01-14: qty 4

## 2016-01-14 MED ORDER — METOPROLOL TARTRATE 1 MG/ML IV SOLN
5.0000 mg | Freq: Once | INTRAVENOUS | Status: AC
  Administered 2016-01-14: 5 mg via INTRAVENOUS

## 2016-01-14 NOTE — Progress Notes (Signed)
  Echocardiogram 2D Echocardiogram with Definity has been performed.  Tye SavoyCasey N Mikeria Valin 01/14/2016, 5:05 PM

## 2016-01-14 NOTE — Progress Notes (Addendum)
Occupational Therapy Treatment Patient Details Name: Richard MedinaDonald R Lawson MRN: [id number] DOB: 1932-08-18 Today's Date: 01/14/2016    History of present illness 82 y.o. found unresponsive at home. PMH includes DM, GERD, dementia, paroxysmal Afib, retinitis pigmentosa of both eyes, Rt shoulder surgery.   OT comments  Pt incontinent in session. OT provided education to wife. Updated d/c recommendation to HHOT.  Follow Up Recommendations  Home health OT;Supervision/Assistance - 24 hour    Equipment Recommendations  3 in 1 bedside comode (if pt does not have one at home)    Recommendations for Other Services      Precautions / Restrictions Precautions Precautions: Fall Precaution Comments: pt blind Restrictions Weight Bearing Restrictions: No       Mobility Bed Mobility Overal bed mobility: Needs Assistance Bed Mobility: Supine to Sit     Supine to sit: Supervision     General bed mobility comments: with verbal and tactile cues  Transfers Overall transfer level: Needs assistance   Transfers: Sit to/from Stand;Stand Pivot Transfers Sit to Stand:  (Mod assist-Min assist) Stand pivot transfers: Mod assist       General transfer comment: cues given    Balance      Assist given for standing balance.                             ADL Overall ADL's : Needs assistance/impaired              Upper Body Dressing: OT untied gown and pt able to take it off of his arms; Min assist; Sitting           Toilet Transfer: Moderate assistance;Stand-pivot;BSC   Toileting- Clothing Manipulation and Hygiene: Maximal assistance;Sit to/from stand Toileting - Clothing Manipulation Details (indicate cue type and reason): doffed briefs and hygiene     Functional mobility during ADLs: Moderate assistance (stand pivot) General ADL Comments: Pt incontinent of BM and urine in session. OT assisted in cleaning pt. Spoke about recommending HHOT. Educated wife on safety such as  rugs on floor and recommended her be with him for shower transfer. Discussed using shower chair or 3 in 1 for him to sit on.       Vision                     Perception     Praxis      Cognition  Awake/Alert Behavior During Therapy: WFL for tasks assessed/performed Overall Cognitive Status: History of cognitive impairments - at baseline (dementia); wife upset that pt had BM in his briefs and he did had not called nurse.                       Extremity/Trunk Assessment               Exercises     Shoulder Instructions       General Comments      Pertinent Vitals/ Pain       Pain Assessment: No/denies pain  Home Living                                          Prior Functioning/Environment              Frequency Min 2X/week     Progress Toward Goals  OT Goals(current goals  can now be found in the care plan section)  Progress towards OT goals: Not progressing toward goals - comment (pt incontinent in session)  Acute Rehab OT Goals Patient Stated Goal: not stated OT Goal Formulation: With patient Time For Goal Achievement: 01/20/16 Potential to Achieve Goals: Good ADL Goals Pt Will Perform Lower Body Dressing: with supervision;sit to/from stand;with set-up Pt Will Transfer to Toilet: ambulating;with supervision;with set-up;regular height toilet Additional ADL Goal #1: Pt will perform HEP for bilateral UEs with set up assist to increase strength.  Plan Discharge plan needs to be updated    Co-evaluation                 End of Session     Activity Tolerance Patient tolerated treatment well   Patient Left Other (comment) (on [facility] with nursing tech in room)   Nurse Communication Other (comment) (nurse helped clean floor; asked tech for help)        Time: 0102-7253 OT Time Calculation (min): 21 min  Charges: OT General Charges $OT Visit: 1 Procedure OT Treatments $Self Care/Home Management : 8-22  mins  Earlie Raveling OTR/L [phone] 01/14/2016, 11:56 AM

## 2016-01-14 NOTE — Progress Notes (Signed)
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 Patient was sent to the ER after having a syncopal episode with collapse at home. Patient reported to his wife earlier this morning that he felt like he had an excessive amount of saliva in his mouth but denied nausea, abdominal pain, chest pressure breath or awareness of palpitations. The wife walked out of the room and later went to check on the patient found him unresponsive on the bed. EMS was called to the home and the patient remained unresponsive upon their arrival. Eventually the patient began to slowly wake up and according to the wife patient had returned to baseline. Patient did not have any tongue biting that had been incontinent of urine. Pulse ox was in the 90s per EMS.  EKG:, Sinus bradycardia ventricular rate 56 bpm, QTC 417 ms, incomplete right bundle branch block versus right axis deviation, no ischemic changes.  Assessment and plan   Syncope with collapse -Etiology unclear but differential includes orthostasis versus seizure activity versus arrhythmia (atrial fibrillation with rapid ventricular response and tachybradycardia syndrome is definitely a possibility); patient with nocturnal hypoxemia has sleep apnea and is noncompliant   Cont   telemetry/Obs -Echocardiogram pending,. Last echo in 2015 -EEG pending -PT/OT evaluation Recent MRI  brain 2/9 was negative , CT of the head negative Discussed with Dr Jacinto Halim, via telephone, hold off on official consult for now, he will be setup with an event monitor on Monday    Paroxysmal atrial fibrillation/tachybradycardia syndrome with rapid ventricular response today Restarted it beta blocker, patient converted to normal sinus rhythm after 5 mg of IV metoprolol -Continue Edoxaban Cardiology aware, about the possibility of tachybrady syndrome    DM w/o complication type II  -Diet controlled at home -Mildly hyperglycemic here so follow CBGs and provide SSI   OSA on CPAP -Continue nocturnal CPAP   Dementia According to the wife the patient was recently started on Aricept   Blindness     DVT prophylaxsis edoxaban   Code Status:      Code Status Orders        Start     Ordered   01/12/16 2012  Full code   Continuous     01/12/16 2011    Code Status History    Date Active Date Inactive Code Status Order ID Comments User Context   01/21/2015 12:11 AM 01/24/2015  3:38 PM Full Code [id number]  Therisa Doyne, MD Inpatient      Family Communication: Discussed in detail with the patient and his wife, all imaging results, lab results explained to the patient   Disposition Plan:  Anticipate discharge tomorrow     Consultants:  Cardiology  Procedures:  None  Antibiotics: Anti-infectives    None         HPI/Subjective: Patient will be in atrial  fibrillation with rapid ventricular response this morning, heart rate in the 120s, some shortness of breath. Nonproductive cough  Objective: Filed Vitals:   01/14/16 0207 01/14/16 0437 01/14/16 0611 01/14/16 1100  BP: 120/68 112/67 113/79 92/63  Pulse: 76 101 128 80  Temp: 97.3 F (36.3 C) 98 F (36.7 C) 97.8 F (36.6 C) 97.7 F (36.5 C)  TempSrc: Oral Oral Oral Oral  Resp: 16 18 18 16   Weight:    84.006 kg (185 lb 3.2 oz)   SpO2: 97% 97% 94% 93%   No intake or output data in the 24 hours ending 01/14/16 1155  Exam:  General: No acute respiratory distress Lungs: Clear to auscultation bilaterally without wheezes or crackles Cardiovascular: Regular rate and rhythm without murmur gallop or rub normal S1 and S2 Abdomen: Nontender, nondistended, soft, bowel sounds positive, no rebound, no ascites, no appreciable mass Extremities: No significant cyanosis, clubbing, or edema bilateral lower extremities     Data Review   Micro Results No results found for this or any previous visit (from the past 240 hour(s)).  Radiology Reports Dg Chest 2 View  01/14/2016  CLINICAL DATA:  Cough, shortness of breath EXAM: CHEST  2 VIEW COMPARISON:  01/12/2016 FINDINGS: Stable mild to moderate cardiac enlargement with calcification of the aorta. Vascular pattern is normal. There is limited inspiratory effect. Bilateral lower lobe atelectasis similar to prior study. IMPRESSION: Limited inspiratory effect. Bibasilar opacities again suggest lower lobe atelectasis. Electronically Signed   By: Esperanza Heiraymond  Rubner M.D.   On: 01/14/2016 08:30   Ct Head Wo Contrast  01/12/2016  CLINICAL DATA:  Unresponsive and decreased respirations. EXAM: CT HEAD WITHOUT CONTRAST TECHNIQUE: Contiguous axial images were obtained from the base of the skull through the vertex without contrast. COMPARISON:  10/28/2009 FINDINGS: Right vertebral artery is heavily calcified. Cerebral atrophy is similar to the previous examination. No evidence for acute hemorrhage, mass lesion, midline shift, hydrocephalus or large infarct. Mucosal disease along the inferior right maxillary sinus. No calvarial fracture. IMPRESSION: No acute intracranial abnormality. Cerebral atrophy. Electronically Signed   By: Richarda OverlieAdam  Henn M.D.   On: 01/12/2016 13:59   Dg Chest Portable 1 View  01/12/2016  CLINICAL DATA:  Cough and sob since yesterday EXAM: PORTABLE CHEST 1 VIEW  COMPARISON:  CT, 04/21/2015.  Chest radiograph, 11/21/2014. FINDINGS: Cardiac silhouette is normal in size. No mediastinal or hilar masses or evidence of adenopathy. Mild lung base atelectasis similar to the prior studies. No evidence of pneumonia or edema. No pleural effusion or pneumothorax. Bony thorax demineralized but grossly intact. IMPRESSION: Acute cardiopulmonary disease. Electronically Signed   By: Amie Portlandavid  Ormond M.D.   On: 01/12/2016 12:10     CBC  Recent Labs Lab 01/12/16 1110 01/13/16 0652  WBC 7.4 8.7  HGB 14.9 14.9  HCT 43.9 45.3  PLT 248 253  MCV 90.7 91.1  MCH 30.8 30.0  MCHC 33.9 32.9  RDW 12.9 13.1  LYMPHSABS 1.6  --   MONOABS 0.7  --   EOSABS 0.2  --   BASOSABS 0.1  --     Chemistries   Recent Labs Lab 01/12/16 1110 01/12/16 2114 01/13/16 0652  NA 140  --  139  K 4.3  --  3.9  CL 106  --  107  CO2 24  --  23  GLUCOSE 151*  --  129*  BUN 7  --  7  CREATININE 0.80  --  0.76  CALCIUM 8.9 8.6* 8.9  MG  --  2.0  --   AST 23  --   --   ALT 27  --   --   ALKPHOS 68  --   --   BILITOT 0.7  --   --    ------------------------------------------------------------------------------------------------------------------ estimated creatinine clearance is 76.4 mL/min (by C-G formula based on Cr of 0.76). ------------------------------------------------------------------------------------------------------------------  Recent Labs  01/12/16 1558  HGBA1C 6.9*   ------------------------------------------------------------------------------------------------------------------ No results for input(s): CHOL, HDL, LDLCALC, TRIG, CHOLHDL, LDLDIRECT in the last 72 hours. ------------------------------------------------------------------------------------------------------------------  Recent Labs  01/12/16 2114  TSH 3.222   ------------------------------------------------------------------------------------------------------------------ No results for input(s):  VITAMINB12, FOLATE, FERRITIN, TIBC, IRON, RETICCTPCT in the last 72 hours.  Coagulation profile No results for input(s): INR, PROTIME in the last 168 hours.   Recent Labs  01/12/16 1440  DDIMER 0.35    Cardiac Enzymes  Recent Labs Lab 01/12/16 1110  TROPONINI <0.03   ------------------------------------------------------------------------------------------------------------------ Invalid input(s): POCBNP   CBG:  Recent Labs Lab 01/13/16 0805 01/13/16 1206 01/13/16 1651 01/13/16 2152 01/14/16 0755  GLUCAP 128* 122* 121* 167* 124*       Studies: Dg Chest 2 View  01/14/2016  CLINICAL DATA:  Cough, shortness of breath EXAM: CHEST  2 VIEW COMPARISON:  01/12/2016 FINDINGS: Stable mild to moderate cardiac enlargement with calcification of the aorta. Vascular pattern is normal. There is limited inspiratory effect. Bilateral lower lobe atelectasis similar to prior study. IMPRESSION: Limited inspiratory effect. Bibasilar opacities again suggest lower lobe atelectasis. Electronically Signed   By: Esperanza Heir M.D.   On: 01/14/2016 08:30   Ct Head Wo Contrast  01/12/2016  CLINICAL DATA:  Unresponsive and decreased respirations. EXAM: CT HEAD WITHOUT CONTRAST TECHNIQUE: Contiguous axial images were obtained from the base of the skull through the vertex without contrast. COMPARISON:  10/28/2009 FINDINGS: Right vertebral artery is heavily calcified. Cerebral atrophy is similar to the previous examination. No evidence for acute hemorrhage, mass lesion, midline shift, hydrocephalus or large infarct. Mucosal disease along the inferior right maxillary sinus. No calvarial fracture. IMPRESSION: No acute intracranial abnormality. Cerebral atrophy. Electronically Signed   By: [name] M.D.   On: 01/12/2016 13:59   Dg Chest Portable 1 View  01/12/2016  CLINICAL DATA:  Cough and sob since yesterday EXAM: PORTABLE CHEST 1 VIEW COMPARISON:  CT, 04/21/2015.  Chest radiograph, 11/21/2014.  FINDINGS: Cardiac silhouette is normal in size. No mediastinal or hilar masses or evidence of adenopathy. Mild lung base atelectasis similar to the prior studies. No evidence of pneumonia or edema. No pleural effusion or pneumothorax. Bony thorax demineralized but grossly intact. IMPRESSION: Acute cardiopulmonary disease. Electronically Signed   By: Amie Portland M.D.   On: 01/12/2016 12:10      Lab Results  Component Value Date   HGBA1C 6.9* 01/12/2016   HGBA1C 7.1* 01/20/2015   Lab Results  Component Value Date   CREATININE 0.76 01/13/2016       Scheduled Meds: . edoxaban  60 mg Oral Daily  . feeding supplement (ENSURE ENLIVE)  237 mL Oral BID BM  . feeding supplement (GLUCERNA SHAKE)  237 mL Oral BID BM  . insulin aspart  0-5 Units Subcutaneous QHS  . insulin aspart  0-9 Units Subcutaneous TID WC  . metoprolol tartrate  25 mg Oral BID  . pantoprazole  40 mg Oral Daily  . sodium chloride flush  3 mL Intravenous Q12H   Continuous Infusions:   Principal Problem:   Syncope Active Problems:   Paroxysmal atrial fibrillation (HCC)   DM w/o  complication type II (HCC)   Obstructive sleep apnea syndrome   OSA on CPAP   Blindness   Bradycardia   Dementia   Syncope and collapse   Altered mental status   Protein-calorie malnutrition, severe    Time spent: 45 minutes   [facility]  [facility] Pager [phone]. If 7PM-7AM, please contact night-coverage at [email or web address], password [facility] 01/14/2016, 11:55 AM

## 2016-01-14 NOTE — Progress Notes (Signed)
Pt c/o SOB,unable to tolerated CPAP last night. Assessed pt, pt is a&o, VSS, increased o2 via [place] to 3L/min, encouraged deep breathing, pt stated he felt better. Will cont to monitor.  Wife called RN at [id number], RN assessed pt, pt is having a congested cough. Only able to cough up some, mucus appears to be clear. Lungs clear. MD paged and made awared. Will cont to monitor.

## 2016-01-14 NOTE — Progress Notes (Signed)
RT attempted to place patient on CPAP. Patient was not able to tolerate the mask. RT trade multiple times. Patient in no distress. RT will continue to monitor as needed.

## 2016-01-15 ENCOUNTER — Observation Stay (HOSPITAL_COMMUNITY): Payer: Medicare Other

## 2016-01-15 DIAGNOSIS — R001 Bradycardia, unspecified: Secondary | ICD-10-CM | POA: Diagnosis not present

## 2016-01-15 DIAGNOSIS — R400 Somnolence: Secondary | ICD-10-CM | POA: Diagnosis not present

## 2016-01-15 DIAGNOSIS — R55 Syncope and collapse: Secondary | ICD-10-CM

## 2016-01-15 DIAGNOSIS — R410 Disorientation, unspecified: Secondary | ICD-10-CM

## 2016-01-15 DIAGNOSIS — R4182 Altered mental status, unspecified: Secondary | ICD-10-CM | POA: Diagnosis not present

## 2016-01-15 LAB — GLUCOSE, CAPILLARY
GLUCOSE-CAPILLARY: 153 mg/dL — AB (ref 65–99)
Glucose-Capillary: 128 mg/dL — ABNORMAL HIGH (ref 65–99)
Glucose-Capillary: 218 mg/dL — ABNORMAL HIGH (ref 65–99)
Glucose-Capillary: 169 mg/dL — ABNORMAL HIGH (ref 65–99)
Glucose-Capillary: 161 mg/dL — ABNORMAL HIGH (ref 65–99)

## 2016-01-15 MED ORDER — METOPROLOL TARTRATE 12.5 MG HALF TABLET
12.5000 mg | ORAL_TABLET | Freq: Two times a day (BID) | ORAL | Status: DC
  Administered 2016-01-15 – 2016-01-16 (×2): 12.5 mg via ORAL
  Filled 2016-01-15 (×2): qty 1

## 2016-01-15 NOTE — Progress Notes (Signed)
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 Patient was sent to the ER after having a syncopal episode with collapse at home. Patient reported to his wife earlier this morning that he felt like he had an excessive amount of saliva in his mouth but denied nausea, abdominal pain, chest pressure breath or awareness of palpitations. The wife walked out of the room and later went to check on the patient found him unresponsive on the bed. EMS was called to the home and the patient remained unresponsive upon their arrival. Eventually the patient began to slowly wake up and according to the wife patient had returned to baseline. Patient did not have any tongue biting that had been incontinent of urine. Pulse ox was in the 90s per EMS.  EKG:, Sinus bradycardia ventricular rate 56 bpm, QTC 417 ms, incomplete right bundle branch block versus right axis deviation, no ischemic changes.    Assessment and plan Syncope with collapse -Etiology unclear but differential includes orthostasis versus seizure activity versus arrhythmia (atrial fibrillation with rapid ventricular response and tachybradycardia syndrome is definitely a possibility); patient with nocturnal hypoxemia has sleep apnea and is noncompliant   Cont   telemetry/Obs -Echocardiogram LV EF: 60% -  65% this admission,. Last echo in 2015 -EEG within normal limits -PT/OT evaluation-recommend home health Recent outpatient MRI  brain 2/9 was negative , CT of the head negative Discussed with Dr Jacinto HalimGanji,  he does not see any obvious arrhythmia at this time that can explain the patient's syncopal episode, therefore neurology consultation is being obtained Patient will be set up for an event monitor by cardiology      Paroxysmal atrial fibrillation/tachybradycardia syndrome with rapid ventricular response today Restarted it beta blocker, decrease dose, apparently patient did not have atrial fibrillation, probably just had sinus tachycardia, which resolved with 5 mg of IV metoprolol -Continue Edoxaban     DM w/o complication type II  -Diet controlled at home -Mildly hyperglycemic here so follow CBGs and provide SSI   OSA on CPAP -Continue nocturnal CPAP   Dementia According to the wife the patient was recently started on Aricept for dementia   Blindness     DVT prophylaxsis edoxaban   Code Status:      Code Status Orders        Start     Ordered   01/12/16 2012  Full code   Continuous     01/12/16 2011    Code Status History    Date Active Date Inactive Code Status Order ID Comments User Context   01/21/2015 12:11 AM 01/24/2015  3:38 PM Full Code [id number]  Therisa DoyneAnastassia Doutova, MD Inpatient      Family Communication: Discussed in detail with the patient and his wife, all imaging results, lab results explained to the patient   Disposition Plan:  Anticipate discharge tomorrow     Consultants:  Cardiology  Procedures:  None  Antibiotics: Anti-infectives    None         HPI/Subjective:  Currently normal sinus rhythm without any episodes of sinus tachycardia overnight  Objective: Filed Vitals:   01/15/16 0011 01/15/16 0405 01/15/16 0500 01/15/16 0926  BP: 113/51 109/66  126/70  Pulse: 65 64  68  Temp:  98 F (36.7 C) 98 F (36.7 C)  97.8 F (36.6 C)  TempSrc: Oral   Oral  Resp: Weight:   82.9 kg (182 lb 12.2 oz)   SpO2: 97% 99%  99%    Intake/Output Summary (Last 24 hours) at 01/15/16 1154 Last data filed at 01/15/16 1038  Gross per 24 hour  Intake    240 ml  Output    400 ml  Net   -160 ml    Exam:  General: No acute respiratory distress Lungs: Clear to auscultation bilaterally without wheezes or crackles Cardiovascular: Regular rate and rhythm without murmur gallop or rub normal S1 and S2 Abdomen: Nontender, nondistended, soft, bowel sounds positive, no rebound, no ascites, no appreciable mass Extremities: No significant cyanosis, clubbing, or edema bilateral lower extremities     Data Review   Micro Results No results found for this or any previous visit (from the past 240 hour(s)).  Radiology Reports Dg Chest 2 View  01/14/2016  CLINICAL DATA:  Cough, shortness of breath EXAM: CHEST  2 VIEW COMPARISON:  01/12/2016 FINDINGS: Stable mild to moderate cardiac enlargement with calcification of the aorta. Vascular pattern is normal. There is limited inspiratory effect. Bilateral lower lobe atelectasis similar to prior study. IMPRESSION: Limited inspiratory effect. Bibasilar opacities again suggest lower lobe atelectasis. Electronically Signed   By: Esperanza Heir M.D.   On: 01/14/2016 08:30   Ct Head Wo Contrast  01/12/2016  CLINICAL DATA:  Unresponsive and decreased respirations. EXAM: CT HEAD WITHOUT CONTRAST TECHNIQUE: Contiguous axial images were obtained from the base of the skull through the vertex without contrast. COMPARISON:  10/28/2009 FINDINGS: Right vertebral artery is heavily calcified. Cerebral atrophy is similar to the previous examination. No evidence for acute hemorrhage, mass lesion, midline shift, hydrocephalus or large infarct. Mucosal disease along the inferior right maxillary sinus. No calvarial fracture. IMPRESSION: No acute intracranial  abnormality. Cerebral atrophy. Electronically Signed   By: [name] M.D.   On: 01/12/2016 13:59   Dg Chest Portable 1 View  01/12/2016  CLINICAL DATA:  Cough and sob since yesterday EXAM: PORTABLE CHEST 1 VIEW COMPARISON:  CT, 04/21/2015.  Chest radiograph, 11/21/2014. FINDINGS: Cardiac silhouette is normal in size. No mediastinal or hilar masses or evidence of adenopathy. Mild lung base atelectasis similar to the prior studies. No evidence of pneumonia or edema. No pleural effusion or pneumothorax. Bony thorax demineralized but grossly intact. IMPRESSION: Acute cardiopulmonary disease. Electronically Signed   By: Amie Portland M.D.   On: 01/12/2016 12:10     CBC  Recent Labs Lab 01/12/16 1110 01/13/16 0652  WBC 7.4 8.7  HGB 14.9 14.9  HCT 43.9 45.3  PLT 248 253  MCV 90.7 91.1  MCH 30.8 30.0  MCHC 33.9 32.9  RDW 12.9 13.1  LYMPHSABS 1.6  --   MONOABS 0.7  --   EOSABS 0.2  --   BASOSABS 0.1  --     Chemistries   Recent Labs Lab 01/12/16 1110 01/12/16 2114 01/13/16 0652  NA 140  --  139  K 4.3  --  3.9  CL 106  --  107  CO2 24  --  23  GLUCOSE 151*  --  129*  BUN 7  --  7  CREATININE 0.80  --  0.76  CALCIUM 8.9 8.6* 8.9  MG  --  2.0  --   AST 23  --   --   ALT 27  --   --   ALKPHOS 68  --   --   BILITOT 0.7  --   --    ------------------------------------------------------------------------------------------------------------------ estimated creatinine clearance is 76 mL/min (by C-G formula based on Cr of 0.76). ------------------------------------------------------------------------------------------------------------------  Recent Labs  01/12/16 1558  HGBA1C 6.9*   ------------------------------------------------------------------------------------------------------------------ No results for input(s): CHOL, HDL, LDLCALC, TRIG, CHOLHDL, LDLDIRECT in the last 72  hours. ------------------------------------------------------------------------------------------------------------------  Recent Labs  01/12/16 2114  TSH 3.222   ------------------------------------------------------------------------------------------------------------------ No results for input(s): VITAMINB12, FOLATE, FERRITIN, TIBC, IRON, RETICCTPCT in the last 72 hours.  Coagulation profile No results for input(s): INR, PROTIME in the last 168 hours.   Recent Labs  01/12/16 1440  DDIMER 0.35    Cardiac Enzymes  Recent Labs Lab 01/12/16 1110  TROPONINI <0.03   ------------------------------------------------------------------------------------------------------------------ Invalid input(s): POCBNP   CBG:  Recent Labs Lab 01/14/16 0755 01/14/16 1220 01/14/16 1834 01/14/16 2220 01/15/16 0924  GLUCAP 124* 180* 148* 198* 153*       Studies: Dg Chest 2 View  01/14/2016  CLINICAL DATA:  Cough, shortness of breath EXAM: CHEST  2 VIEW COMPARISON:  01/12/2016 FINDINGS: Stable mild to moderate cardiac enlargement with calcification of the aorta. Vascular pattern is normal. There is limited inspiratory effect. Bilateral lower lobe atelectasis similar to prior study. IMPRESSION: Limited inspiratory effect. Bibasilar opacities again suggest lower lobe atelectasis. Electronically Signed   By: Esperanza Heir M.D.   On: 01/14/2016 08:30      Lab Results  Component Value Date   HGBA1C 6.9* 01/12/2016   HGBA1C 7.1* 01/20/2015   Lab Results  Component Value Date   CREATININE 0.76 01/13/2016       Scheduled Meds: . edoxaban  60 mg Oral Daily  . feeding supplement (ENSURE ENLIVE)  237 mL Oral BID BM  . feeding supplement (GLUCERNA SHAKE)  237 mL Oral BID BM  . insulin aspart  0-5 Units Subcutaneous QHS  . insulin aspart  0-9 Units Subcutaneous TID WC  . metoprolol tartrate  12.5 mg Oral BID  . pantoprazole  40 mg Oral Daily  . sodium chloride flush  3 mL  Intravenous Q12H   Continuous Infusions:   Principal Problem:   Syncope Active Problems:   Paroxysmal atrial fibrillation (HCC)   DM w/o complication type II (HCC)   Obstructive sleep apnea syndrome   OSA on CPAP   Blindness   Bradycardia   Dementia   Syncope and collapse   Altered mental status   Protein-calorie malnutrition, severe    Time spent: 45 minutes   [facility]  [facility] Pager [phone]. If 7PM-7AM, please contact night-coverage at [email or web address], password [facility] 01/15/2016, 11:54 AM

## 2016-01-15 NOTE — Progress Notes (Signed)
Physical Therapy Treatment Patient Details Name: Richard Lawson MRN: [id number] DOB: 10/26/1932 Today's Date: 01/15/2016    History of Present Illness 82 y.o. found unresponsive at home. PMH includes DM, GERD, dementia, paroxysmal Afib, retinitis pigmentosa of both eyes, Rt shoulder surgery.    PT Comments    Pt able to increase ambulation distance with HHA and 50% rail.  Continue to recommend HHPT for home safety evaluation. Discussed Life Alert with wife.    Follow Up Recommendations  Home health PT;Supervision/Assistance - 24 hour     Equipment Recommendations  None recommended by PT    Recommendations for Other Services       Precautions / Restrictions Precautions Precautions: Fall Precaution Comments: pt blind Restrictions Weight Bearing Restrictions: No    Mobility  Bed Mobility               General bed mobility comments: sitting up in chair upon arrival  Transfers Overall transfer level: Needs assistance Equipment used: 1 person hand held assist Transfers: Sit to/from Stand Sit to Stand: Min assist         General transfer comment: cues given  Ambulation/Gait Ambulation/Gait assistance: Min assist Ambulation Distance (Feet): 120 Feet Assistive device: 1 person hand held assist (rail about 50% of the time) Gait Pattern/deviations: Decreased stride length;Trunk flexed;Step-through pattern Gait velocity: decreased   General Gait Details: HHA on the L and rail on the R 50% of the time with cues for hand placement and obstacle negotiation due to blindness   Stairs            Wheelchair Mobility    Modified Rankin (Stroke Patients Only)       Balance Overall balance assessment: Needs assistance;History of Falls           Standing balance-Leahy Scale: Fair Standing balance comment: HHA                     Cognition Arousal/Alertness: Awake/alert Behavior During Therapy: WFL for tasks assessed/performed Overall Cognitive  Status: History of cognitive impairments - at baseline                      Exercises General Exercises - Lower Extremity Long Arc Quad: Strengthening;Both;10 reps;Seated Hip ABduction/ADduction: Strengthening;Both;10 reps Hip Flexion/Marching: Strengthening;Both;10 reps;Seated    General Comments General comments (skin integrity, edema, etc.): A for ther ex for form. Lengthy discussion with wife on home safety and set up.  Recommended Life Alert. Also discussed use of BSC or urinal for night time.  Wife thinks that at this time they can manage better wihtout BSC, but may need one in the future.      Pertinent Vitals/Pain Pain Assessment: No/denies pain    Home Living                      Prior Function            PT Goals (current goals can now be found in the care plan section) Acute Rehab PT Goals Patient Stated Goal: home PT Goal Formulation: With patient/family Time For Goal Achievement: 01/20/16 Potential to Achieve Goals: Good Progress towards PT goals: Progressing toward goals    Frequency  Min 3X/week    PT Plan Current plan remains appropriate    Co-evaluation             End of Session Equipment Utilized During Treatment: Gait belt Activity Tolerance: Patient tolerated treatment well Patient left: in  chair;with call bell/phone within reach;with family/visitor present     Time: 1326-1350 PT Time Calculation (min) (ACUTE ONLY): 24 min  Charges:  $Gait Training: 8-22 mins $Therapeutic Exercise: 8-22 mins                    G Codes:      Richard Lawson 01/15/2016, 2:07 PM

## 2016-01-15 NOTE — Care Management Note (Signed)
Case Management Note  Patient Details  Name: Richard Lawson MRN: [id number] Date of Birth: 1932/06/07  Subjective/Objective:                 Spoke with patient and wife at the bedside. Wife states that oatient will be dc'd today. She has set up private duty aide, and [facility] Referral made. Wife states that they do not 3 in 1, but would like to have a shower stool. Order and referral placed.   Action/Plan:   [facility] and [facility].  Expected Discharge Date:                  Expected Discharge Plan:  Home w Home Health Services  In-House Referral:     Discharge planning Services  CM Consult  Post Acute Care Choice:  Durable Medical Equipment, Home Health Choice offered to:  Spouse  DME Arranged:  Shower stool DME Agency:  [facility]  HH Arr[facility]  [facility]  [facility]  Status of Service:  Completed, signed off  Medicare Important Message Given:    Date Medicare IM Given:    Medicare IM give by:    Date Additional Medicare IM Given:    Additional Medicare Important Message give by:     If discussed at Long Length of Stay Meetings, dates discussed:    Additional Comments:  Lawerance SabalDebbie Shaletha Humble, RN 01/15/2016, 3:14 PM

## 2016-01-15 NOTE — Consult Note (Signed)
NEURO HOSPITALIST CONSULT NOTE   Requestig physician: Dr. Susie Cassette   Reason for Consult: Syncope versus seizure  HPI:                                                                                                                                          Richard Lawson is an 82 y.o. male currently being worked up for Alzheimer at [facility].  He recently had a MRI brain and lab work up but wife has not received the results. Per wife she had awoke at 0600 and told her husband to keep sleeping. She went to his bed at 0900 and noted he was not responding. He was breathing fast though. She called EMS and on arrival they could not get him to wake up and she believes they even "pricked his foot".  He was brought to hospital and while in the hospital "all of a sudden woke up and was joking about using the siren on the ambulance". He has no recollection. EEG was obtained and was negative. CT head was also negative. TSH, Folate, UA, Mag and phos were all negative. MRI obtained at [facility] 2/9 was also normal. On 01/14/2016, he developed an episode of atrial fibrillation with RVR which converted to sinus rhythm with IV metoprolol. Currently back to baseline.  There was no fall, syncope, incontinence or jerking per wife.   Past Medical History  Diagnosis Date  . Retinitis pigmentosa of both eyes   . GERD (gastroesophageal reflux disease)   . Paroxysmal a-fib (HCC)   . Type II diabetes mellitus (HCC)   . Dementia     "not from CVA; unclear if it's Alzheimer's dementia at time" (01/12/2016)  . Lung infection 01/2015  . OSA on CPAP   . On home oxygen therapy     w/CPAP  . History of hiatal hernia   . Unresponsiveness 01/12/2016    "at home this am til he got here to ER; probably lasted ~ 2 hours"     Past Surgical History  Procedure Laterality Date  . Orif radial head / neck fracture Right 10/2009  . Fracture surgery    . Cataract extraction      "don't remember which side"  . Transurethral  resection of prostate  02/2010    Family History  Problem Relation Age of Onset  . Lung cancer Father   . COPD Brother     Social History:  reports that he has never smoked. He has never used smokeless tobacco. He reports that he does not drink alcohol or use illicit drugs.  Allergies  Allergen Reactions  . Metformin     Altered Mental Status    MEDICATIONS:  Prior to Admission:  Prescriptions prior to admission  Medication Sig Dispense Refill Last Dose  . Cyanocobalamin (VITAMIN B-12) 2500 MCG SUBL Take 2,500 mcg by mouth daily.   01/11/2016 at Unknown time  . donepezil (ARICEPT) 5 MG tablet Take 5 mg by mouth at bedtime.  2 01/11/2016 at Unknown time  . edoxaban (SAVAYSA) 60 MG TABS tablet Take 60 mg by mouth daily.   01/11/2016 at 800  . metoprolol tartrate (LOPRESSOR) 25 MG tablet Take 25 mg by mouth 2 (two) times daily.    01/11/2016 at 1800  . OVER THE COUNTER MEDICATION Take 325 mg by mouth daily. Megazyme   01/11/2016 at Unknown time   Scheduled: . edoxaban  60 mg Oral Daily  . feeding supplement (ENSURE ENLIVE)  237 mL Oral BID BM  . feeding supplement (GLUCERNA SHAKE)  237 mL Oral BID BM  . insulin aspart  0-5 Units Subcutaneous QHS  . insulin aspart  0-9 Units Subcutaneous TID WC  . metoprolol tartrate  12.5 mg Oral BID  . pantoprazole  40 mg Oral Daily  . sodium chloride flush  3 mL Intravenous Q12H     ROS:                                                                                                                                       History obtained from the patient  General ROS: negative for - chills, fatigue, fever, night sweats, weight gain or weight loss Psychological ROS: negative for - behavioral disorder, hallucinations, memory difficulties, mood swings or suicidal ideation Ophthalmic ROS: negative for - blurry vision, double vision, eye pain or  loss of vision ENT ROS: negative for - epistaxis, nasal discharge, oral lesions, sore throat, tinnitus or vertigo Allergy and Immunology ROS: negative for - hives or itchy/watery eyes Hematological and Lymphatic ROS: negative for - bleeding problems, bruising or swollen lymph nodes Endocrine ROS: negative for - galactorrhea, hair pattern changes, polydipsia/polyuria or temperature intolerance Respiratory ROS: negative for - cough, hemoptysis, shortness of breath or wheezing Cardiovascular ROS: negative for - chest pain, dyspnea on exertion, edema or irregular heartbeat Gastrointestinal ROS: negative for - abdominal pain, diarrhea, hematemesis, nausea/vomiting or stool incontinence Genito-Urinary ROS: negative for - dysuria, hematuria, incontinence or urinary frequency/urgency Musculoskeletal ROS: negative for - joint swelling or muscular weakness Neurological ROS: as noted in HPI Dermatological ROS: negative for rash and skin lesion changes   Blood pressure 100/66, pulse 70, temperature 98 F (36.7 C), temperature source Oral, resp. rate 20, weight 82.9 kg (182 lb 12.2 oz), SpO2 92 %.   Neurologic Examination:  HEENT-  Normocephalic, no lesions, without obvious abnormality.  Normal external eye and conjunctiva.  Normal TM's bilaterally.  Normal auditory canals and external ears. Normal external nose, mucus membranes and septum.  Normal pharynx. Cardiovascular- S1, S2 normal, pulses palpable throughout   Lungs- chest clear, no wheezing, rales, normal symmetric air entry Abdomen- normal findings: bowel sounds normal Extremities- no edema Lymph-no adenopathy palpable Musculoskeletal-no joint tenderness, deformity or swelling Skin-warm and dry, no hyperpigmentation, vitiligo, or suspicious lesions  Neurological Examination Mental Status: Alert, oriented, thought content appropriate.  Speech  fluent without evidence of aphasia.  Able to follow 3 step commands without difficulty. Cranial Nerves: II:  legally blind pupils equal, round, III,IV, VI: ptosis not present, extra-ocular motions intact bilaterally V,VII: smile symmetric, facial light touch sensation normal bilaterally VIII: hearing normal bilaterally IX,X: uvula rises symmetrically XI: bilateral shoulder shrug XII: midline tongue extension Motor: Right : Upper extremity   5/5    Left:     Upper extremity   5/5  Lower extremity   5/5     Lower extremity   5/5 Tone and bulk:normal tone throughout; no atrophy noted Sensory: Pinprick and light touch intact throughout, bilaterally Deep Tendon Reflexes: 1+ and symmetric throughout Plantars: Right: downgoing   Left: downgoing Cerebellar: normal finger-to-nose Gait: not tested      Lab Results: Basic Metabolic Panel:  Recent Labs Lab 01/12/16 1110 01/12/16 2114 01/13/16 0652  NA 140  --  139  K 4.3  --  3.9  CL 106  --  107  CO2 24  --  23  GLUCOSE 151*  --  129*  BUN 7  --  7  CREATININE 0.80  --  0.76  CALCIUM 8.9 8.6* 8.9  MG  --  2.0  --   PHOS  --  2.8  --     Liver Function Tests:  Recent Labs Lab 01/12/16 1110  AST 23  ALT 27  ALKPHOS 68  BILITOT 0.7  PROT 6.1*  ALBUMIN 3.5   No results for input(s): LIPASE, AMYLASE in the last 168 hours. No results for input(s): AMMONIA in the last 168 hours.  CBC:  Recent Labs Lab 01/12/16 1110 01/13/16 0652  WBC 7.4 8.7  NEUTROABS 4.9  --   HGB 14.9 14.9  HCT 43.9 45.3  MCV 90.7 91.1  PLT 248 253    Cardiac Enzymes:  Recent Labs Lab 01/12/16 1110  TROPONINI <0.03    Lipid Panel: No results for input(s): CHOL, TRIG, HDL, CHOLHDL, VLDL, LDLCALC in the last 168 hours.  CBG:  Recent Labs Lab 01/14/16 1834 01/14/16 2220 01/15/16 0403 01/15/16 0924 01/15/16 1204  GLUCAP 148* 198* 128* 153* 218*    Microbiology: Results for orders placed or performed during the hospital  encounter of 01/20/15  Blood culture (routine x 2)     Status: None   Collection Time: 01/20/15  4:50 PM  Result Value Ref Range Status   Specimen Description BLOOD RIGHT HAND  Final   Special Requests BOTTLES DRAWN AEROBIC AND ANAEROBIC 5CC EACH  Final   Culture   Final    NO GROWTH 5 DAYS Performed at [facility]    Report Status 01/26/2015 FINAL  Final  Blood culture (routine x 2)     Status: None   Collection Time: 01/20/15  4:55 PM  Result Value Ref Range Status   Specimen Description BLOOD LEFT ARM  Final   Special Requests BOTTLES DRAWN AEROBIC AND ANAEROBIC 10CC EACH  Final   Culture   Final    NO GROWTH 5 DAYS [facility]    Report Status 01/26/2015 FINAL  Final  Culture, Urine     Status: None   Collection Time: 01/20/15  5:00 PM  Result Value Ref Range Status   Specimen Description URINE, CLEAN CATCH  Final   Special Requests NONE  Final   Colony Count   Final    30,000 COLONIES/ML [facility]formed at [facility]    Culture   Final    ESCHERICHIA COLI Performed at [facility]    Report Status 01/23/2015 FINAL  Final   Organism ID, Bacteria ESCHERICHIA COLI  Final      Susceptibility   Escherichia coli - MIC*    AMPICILLIN <=2 SENSITIVE Sensitive     CEFAZOLIN <=4 SENSITIVE Sensitive     CEFTRIAXONE <=1 SENSITIVE Sensitive     CIPROFLOXACIN <=0.25 SENSITIVE Sensitive     GENTAMICIN <=1 SENSITIVE Sensitive     LEVOFLOXACIN <=0.12 SENSITIVE Sensitive     NITROFURANTOIN <=16 SENSITIVE Sensitive     TOBRAMYCIN <=1 SENSITIVE Sensitive     TRIMETH/SULFA <=20 SENSITIVE Sensitive     PIP/TAZO <=4 SENSITIVE Sensitive     * ESCHERICHIA COLI    Coagulation Studies: No results for input(s): LABPROT, INR in the last 72 hours.  Imaging: Dg Chest 2 View  01/14/2016  CLINICAL DATA:  Cough, shortness of breath EXAM: CHEST  2 VIEW COMPARISON:  01/12/2016 FINDINGS: Stable mild to moderate cardiac enlargement with calcification of  the aorta. Vascular pattern is normal. There is limited inspiratory effect. Bilateral lower lobe atelectasis similar to prior study. IMPRESSION: Limited inspiratory effect. Bibasilar opacities again suggest lower lobe atelectasis. Electronically Signed   By: Esperanza Heiraymond  Rubner M.D.   On: 01/14/2016 08:30    Felicie MornDavid Smith PA-C [facility] [phone]  01/15/2016, 12:52 PM   Assessment/Plan: 82 YO male with new onset of non-responsiveness. EEG, CT and labs non revealing. Etiology unclear as he had a period of Afib while in the hospital. In addition he is being worked up for Alzheimer. Given the prolonged period of not responding cannot totally rule out seizure with post-ictal phase and normal EEG.   Recommend: 1) MRI brain with contrast. If normal would not initiate AED and would have follow up with his primary neurologist out patient.  2) He could benefit from a event monitor to evaluate for intermittent arrhythmia.  3) No driving or operating machinery until cleared by outpatient neurologist  I personally participated in this patient's evaluation and management, including formulating the above clinical assessment and management recommendations.  Venetia MaxonR Krystyn Picking M.D. [facility] [phone]

## 2016-01-15 NOTE — Consult Note (Signed)
CARDIOLOGY CONSULT NOTE  Patient ID: Richard Lawson MRN: [id number] DOB/AGE: 06-15-1932 82 y.o.  Admit date: 01/12/2016 Referring Physician: Internal medicine Primary Physician:   [name], MD Reason for Consultation: syncope  HPI: Richard Lawson  is a 82 y.o. 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  He presented to the emergency department on 01/12/2016 after syncopal episode.  He was already laying in the bed.  His wife went to another room briefly and when she returned she found him unresponsive in the bed.  He initially remained unresponsive on EMS arrival, however regained consciousness soon after.  After the patient was moved out of the house, she also realized that he had wet the bed.  He was initially bradycardic on arrival to the hospital therefore beta blocker was held.  On 01/14/2016, he developed an episode of atrial fibrillation with RVR which converted to sinus rhythm with IV metoprolol.  I was called to consult on the patient due to recurrent episodes of atrial fibrillation with rapid ventricular response. His beta blocker has been reinitiated.  We had planned to place an event monitor in the office today for evaluation of cardiogenic etiology of syncope, however patient remains in the hospital.  Past Medical History  Diagnosis Date  . Retinitis pigmentosa of both eyes   . GERD (gastroesophageal reflux disease)   . Paroxysmal a-fib (HCC)   . Type II diabetes mellitus (HCC)   . Dementia     "not from CVA; unclear if it's Alzheimer's dementia at time" (01/12/2016)  . Lung infection 01/2015  . OSA on CPAP   . On home oxygen therapy     w/CPAP  . History of hiatal hernia   . Unresponsiveness 01/12/2016    "at home this am til he got here to ER; probably lasted ~ 2 hours"      Past Surgical History  Procedure Laterality Date  . Orif radial head / neck fracture  Right 10/2009  . Fracture surgery    . Cataract extraction      "don't remember which side"  . Transurethral resection of prostate  02/2010     Family History  Problem Relation Age of Onset  . Lung cancer Father   . COPD Brother      Social History: Social History   Social History  . Marital Status: Married    Spouse Name: N/A  . Number of Children: 1  . Years of Education: coll   Occupational History  . retired    Social History Main Topics  . Smoking status: Never Smoker   . Smokeless tobacco: Never Used  . Alcohol Use: No  . Drug Use: No  . Sexual Activity: Yes   Other Topics Concern  . Not on file   Social History Narrative   Caffeine 2 glasses daily avg.     Prescriptions prior to admission  Medication Sig Dispense Refill Last Dose  . Cyanocobalamin (VITAMIN B-12) 2500 MCG SUBL Take 2,500 mcg by mouth daily.   01/11/2016 at Unknown time  . donepezil (ARICEPT) 5 MG tablet Take 5 mg by mouth at bedtime.  2 01/11/2016 at Unknown time  . edoxaban (SAVAYSA) 60 MG TABS tablet Take 60 mg by mouth daily.   01/11/2016 at 800  . metoprolol tartrate (LOPRESSOR) 25 MG tablet Take 25 mg by mouth 2 (two) times daily.    01/11/2016 at 1800  .  OVER THE COUNTER MEDICATION Take 325 mg by mouth daily. Megazyme   01/11/2016 at Unknown time     ROS: General: no fevers/chills/night sweats Eyes: blind ENT: no sore throat or hearing loss Resp: no cough, wheezing, or hemoptysis CV: no chest pain, edema or palpitations GI: no abdominal pain, nausea, vomiting, diarrhea, or constipation GU: no dysuria, frequency, or hematuria Skin: no rash Neuro: One syncopal episode; no headache, numbness, tingling, or weakness of extremities Musculoskeletal: no joint pain or swelling Heme: no bleeding, DVT, or easy bruising Endo: no polydipsia or polyuria    Physical Exam: Blood pressure 109/66, pulse 64, temperature 98 F (36.7 C), temperature source Oral, resp. rate 18, weight 82.9 kg (182 lb 12.2  oz), SpO2 99 %.   General appearance: alert, cooperative, appears stated age and blind Lungs: clear to auscultation bilaterally Chest wall: no tenderness Heart: regular rate and rhythm, S1, S2 normal, no murmur, click, rub or gallop Abdomen: soft, non-tender; bowel sounds normal; no masses,  no organomegaly Extremities: extremities normal, atraumatic, no cyanosis or edema Pulses: 2+ and symmetric Neurologic: Grossly normal, complete exam not done and gait not tested  Labs:   Lab Results  Component Value Date   WBC 8.7 01/13/2016   HGB 14.9 01/13/2016   HCT 45.3 01/13/2016   MCV 91.1 01/13/2016   PLT 253 01/13/2016    Recent Labs Lab 01/12/16 1110  01/13/16 0652  NA 140  --  139  K 4.3  --  3.9  CL 106  --  107  CO2 24  --  23  BUN 7  --  7  CREATININE 0.80  --  0.76  CALCIUM 8.9  < > 8.9  PROT 6.1*  --   --   BILITOT 0.7  --   --   ALKPHOS 68  --   --   ALT 27  --   --   AST 23  --   --   GLUCOSE 151*  --  129*  < > = values in this interval not displayed.  HEMOGLOBIN A1C Lab Results  Component Value Date   HGBA1C 6.9* 01/12/2016   MPG 151 01/12/2016    Cardiac Panel (last 3 results)  Recent Labs  01/12/16 1110  TROPONINI <0.03   TSH  Recent Labs  01/12/16 2114  TSH 3.222    EKG 01/15/2016: Sinus bradycardia at a rate of 56 bpm, left axis deviation, left anterior fascicular block, poor R-wave progression, cannot exclude anterior infarct old.  No change from EKG on 11/08/2015.  Telemetry: From admission to now, I do not see any episodes of atrial fibrillation. Episodes marked rapid heartbeat are all normal sinus rhythm/sinus tachycardia. There are occasional PVCs.  Echo 01/14/2016: Left ventricle: The cavity size was normal. Systolic function was normal. The estimated ejection fraction was in the range of 60-65%. Although no diagnostic regional wall motion abnormality was identified, this possibility cannot be completely excluded on the basis of this  study. Doppler parameters are consistent with abnormal left ventricular relaxation (grade 1 diastolic dysfunction).  Lexiscan Myoview stress test in the outpatient basis on 09/30/2014: Normal perfusion.   Radiology: Dg Chest 2 View  01/14/2016  CLINICAL DATA:  Cough, shortness of breath EXAM: CHEST  2 VIEW COMPARISON:  01/12/2016 FINDINGS: Stable mild to moderate cardiac enlargement with calcification of the aorta. Vascular pattern is normal. There is limited inspiratory effect. Bilateral lower lobe atelectasis similar to prior study. IMPRESSION: Limited inspiratory effect. Bibasilar opacities again suggest lower  lobe atelectasis. Electronically Signed   By: Esperanza Heir M.D.   On: 01/14/2016 08:30    Scheduled Meds: . edoxaban  60 mg Oral Daily  . feeding supplement (ENSURE ENLIVE)  237 mL Oral BID BM  . feeding supplement (GLUCERNA SHAKE)  237 mL Oral BID BM  . insulin aspart  0-5 Units Subcutaneous QHS  . insulin aspart  0-9 Units Subcutaneous TID WC  . metoprolol tartrate  25 mg Oral BID  . pantoprazole  40 mg Oral Daily  . sodium chloride flush  3 mL Intravenous Q12H   Continuous Infusions:  PRN Meds:.acetaminophen **OR** acetaminophen, levalbuterol, LORazepam, ondansetron **OR** ondansetron (ZOFRAN) IV  ASSESSMENT AND PLAN:  1. Syncope 2. Paroxysmal atrial fibrillation (CHA2DS2-VASc Score is 3 with yearly risk of stroke of 3.2%.  Bleeding risk is 1.02%/year) 3. 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 OSA and nocturnal hypoxemia 5. Diet controlled diabetes mellitus 6. Dementia slightly advancing recently.  Recommendation: I have reviewed the results of his labs and also his EKG and telemetry strips. The episodes that he had yesterday and day before of tachycardia was all normal sinus rhythm or sinus tachycardia. No further episodes of atrial fibrillation, no high degree AV block. Hence I would recommend continuing present medical therapy, but reduce the dose of metoprolol from 25  mg 2-4.5 mg twice a day.  I would still recommend placing an event monitor which I can do so on the outpatient basis for 30 days to evaluate arrhythmias and heart block.  With regard to syncope, patient had an episode of syncope one lying down in bed, his wife also noticed that he had urinated in the bed when the EMS picked him up. Do not know whether this was related to bradycardic episode leading to syncope and seizure or whether it is a primary neurologic issue as sick sinus syndrome is also highly likely.  I was seen back in the office in 2-3 weeks.  Yates Decamp, MD 01/15/2016, 8:36 AM [facility] Pager: [phone] Office: [phone] If no answer Cell [phone]

## 2016-01-15 NOTE — Progress Notes (Signed)
EEG Completed; Results Pending  

## 2016-01-15 NOTE — Procedures (Signed)
ELECTROENCEPHALOGRAM REPORT  Date of Study: 01/15/2016  Patient's Name: Richard Lawson MRN: [id number] Date of Birth: 1931-11-14  Referring Provider: Junious SilkAllison Ellis, NP  Indication: 82 year old man with dementia who had episode of syncope  Medications: acetaminophen (TYLENOL) suppository 650 mg  edoxaban (SAVAYSA) tablet 60 mg insulin aspart (novoLOG) injection 0-5 Units levalbuterol (XOPENEX) nebulizer solution 1.25 mg LORazepam (ATIVAN) injection 2 mg ondansetron (ZOFRAN) tablet 4 mg pantoprazole (PROTONIX) EC tablet 40 mg  Technical Summary: This is a multichannel digital EEG recording, using the international 10-20 placement system with electrodes applied with paste and impedances below 5000 ohms.    Description: The EEG background is symmetric, with a well-developed posterior dominant rhythm of 8-10 Hz, which is reactive to eye opening and closing.  Diffuse beta activity is seen, with a bilateral frontal preponderance.  No focal or generalized abnormalities are seen.  No focal or generalized epileptiform discharges are seen.  Stage II sleep is not seen.  Hyperventilation and photic stimulation were not performed.  ECG poorly discernible.  Impression: This is a normal routine EEG of the awake and drowsy states, without activating procedures.  A normal study does not rule out the possibility of a seizure disorder in this patient.  Adam R. Everlena CooperJaffe, DO

## 2016-01-16 DIAGNOSIS — R001 Bradycardia, unspecified: Secondary | ICD-10-CM | POA: Diagnosis not present

## 2016-01-16 DIAGNOSIS — R55 Syncope and collapse: Secondary | ICD-10-CM | POA: Diagnosis not present

## 2016-01-16 DIAGNOSIS — R400 Somnolence: Secondary | ICD-10-CM | POA: Diagnosis not present

## 2016-01-16 DIAGNOSIS — F0390 Unspecified dementia without behavioral disturbance: Secondary | ICD-10-CM | POA: Diagnosis not present

## 2016-01-16 LAB — COMPREHENSIVE METABOLIC PANEL
ALT: 26 U/L (ref 17–63)
ANION GAP: 10 (ref 5–15)
AST: 20 U/L (ref 15–41)
Albumin: 3.3 g/dL — ABNORMAL LOW (ref 3.5–5.0)
Alkaline Phosphatase: 68 U/L (ref 38–126)
BILIRUBIN TOTAL: 0.7 mg/dL (ref 0.3–1.2)
BUN: 15 mg/dL (ref 6–20)
CO2: 26 mmol/L (ref 22–32)
Calcium: 8.9 mg/dL (ref 8.9–10.3)
Chloride: 104 mmol/L (ref 101–111)
Creatinine, Ser: 0.86 mg/dL (ref 0.61–1.24)
GFR calc Af Amer: >60 mL/min (ref >60)
Glucose, Bld: 130 mg/dL — ABNORMAL HIGH (ref 65–99)
POTASSIUM: 3.9 mmol/L (ref 3.5–5.1)
Sodium: 140 mmol/L (ref 135–145)
TOTAL PROTEIN: 6 g/dL — AB (ref 6.5–8.1)

## 2016-01-16 LAB — URINALYSIS, ROUTINE W REFLEX MICROSCOPIC
GLUCOSE, UA: NEGATIVE mg/dL
HGB URINE DIPSTICK: NEGATIVE
KETONES UR: 15 mg/dL — AB
LEUKOCYTES UA: NEGATIVE
Nitrite: NEGATIVE
PH: 5.5 (ref 5.0–8.0)
Protein, ur: NEGATIVE mg/dL
Specific Gravity, Urine: 1.037 — ABNORMAL HIGH (ref 1.005–1.030)

## 2016-01-16 LAB — CBC
HEMATOCRIT: 44.6 % (ref 39.0–52.0)
Hemoglobin: 14.5 g/dL (ref 13.0–17.0)
MCH: 29.6 pg (ref 26.0–34.0)
MCHC: 32.5 g/dL (ref 30.0–36.0)
MCV: 91 fL (ref 78.0–100.0)
Platelets: 241 10*3/uL (ref 150–400)
RBC: 4.9 MIL/uL (ref 4.22–5.81)
RDW: 13 % (ref 11.5–15.5)
WBC: 9 10*3/uL (ref 4.0–10.5)

## 2016-01-16 LAB — GLUCOSE, CAPILLARY
GLUCOSE-CAPILLARY: 133 mg/dL — AB (ref 65–99)
Glucose-Capillary: 136 mg/dL — ABNORMAL HIGH (ref 65–99)

## 2016-01-16 MED ORDER — GADOBENATE DIMEGLUMINE 529 MG/ML IV SOLN
15.0000 mL | Freq: Once | INTRAVENOUS | Status: AC | PRN
  Administered 2016-01-16: 15 mL via INTRAVENOUS

## 2016-01-16 MED ORDER — METOPROLOL TARTRATE 25 MG PO TABS: 12.5000 mg | ORAL_TABLET | Freq: Two times a day (BID) | ORAL | Status: DC

## 2016-01-16 NOTE — Progress Notes (Signed)
[facility]  Discussed with the patient and all questions fully answered.  VSS, Skin clean, dry and intact without evidence of skin break down, no evidence of skin tears noted. IV catheter discontinued intact. Site without signs and symptoms of complications. Dressing and pressure applied.  An After Visit Summary was printed and given to the patient. Patient received prescription.  D/c education completed with patient/family including follow up instructions, medication list, d/c activities limitations if indicated, with other d/c instructions as indicated by MD - patient able to verbalize understanding, all questions fully answered.   Patient instructed to return to ED, call 911, or call MD for any changes in condition.   Patient escorted via WC, and D/C home via private auto.  Pura SpiceJessica K Edwards 01/16/2016 1:25 PM

## 2016-01-16 NOTE — Discharge Instructions (Signed)
No driving or operating machinery until cleared by outpatient neurologist

## 2016-01-16 NOTE — Discharge Summary (Signed)
Physician Discharge Summary  Richard Lawson MRN: [id number] DOB/AGE: 11-Mar-1932 82 y.o.  PCP:  Melinda Crutch, MD   Admit date: 01/12/2016 Discharge date: 01/16/2016  Discharge Diagnoses:     Principal Problem:   Syncope Active Problems:   Paroxysmal atrial fibrillation (HCC)   DM w/o complication type II (HCC)   Obstructive sleep apnea syndrome   OSA on CPAP   Blindness   Bradycardia   Dementia   Syncope and collapse   Altered mental status   Protein-calorie malnutrition, severe    Follow-up recommendations Follow-up with PCP in 3-5 days , including all  additional recommended appointments as below Follow-up CBC, CMP in 3-5 days  Patient is to follow-up with Dr.Ganji  to pick up  event monitor     Medication List    STOP taking these medications        donepezil 5 MG tablet  Commonly known as:  ARICEPT      TAKE these medications        edoxaban 60 MG Tabs tablet  Commonly known as:  SAVAYSA  Take 60 mg by mouth daily.     metoprolol tartrate 25 MG tablet  Commonly known as:  LOPRESSOR  Take 0.5 tablets (12.5 mg total) by mouth 2 (two) times daily.     OVER THE COUNTER MEDICATION  Take 325 mg by mouth daily. Megazyme     Vitamin B-12 2500 MCG Subl  Take 2,500 mcg by mouth daily.         Discharge Condition:   Discharge Instructions       Discharge Instructions    Diet - low sodium heart healthy    Complete by:  As directed      Increase activity slowly    Complete by:  As directed            Allergies  Allergen Reactions  . Metformin     Altered Mental Status      Disposition: 01-Home or Self Care   Consults:  Neurology Cardiology     Significant Diagnostic Studies:  Dg Chest 2 View  01/14/2016  CLINICAL DATA:  Cough, shortness of breath EXAM: CHEST  2 VIEW COMPARISON:  01/12/2016 FINDINGS: Stable mild to moderate cardiac enlargement with calcification of the aorta. Vascular pattern is normal. There is limited inspiratory  effect. Bilateral lower lobe atelectasis similar to prior study. IMPRESSION: Limited inspiratory effect. Bibasilar opacities again suggest lower lobe atelectasis. Electronically Signed   By: Skipper Cliche M.D.   On: 01/14/2016 08:30   Ct Head Wo Contrast  01/12/2016  CLINICAL DATA:  Unresponsive and decreased respirations. EXAM: CT HEAD WITHOUT CONTRAST TECHNIQUE: Contiguous axial images were obtained from the base of the skull through the vertex without contrast. COMPARISON:  10/28/2009 FINDINGS: Right vertebral artery is heavily calcified. Cerebral atrophy is similar to the previous examination. No evidence for acute hemorrhage, mass lesion, midline shift, hydrocephalus or large infarct. Mucosal disease along the inferior right maxillary sinus. No calvarial fracture. IMPRESSION: No acute intracranial abnormality. Cerebral atrophy. Electronically Signed   By: Markus Daft M.D.   On: 01/12/2016 13:59   Mr Jeri Cos QV Contrast  01/16/2016  CLINICAL DATA:  Initial evaluation for new onset non responsiveness. EXAM: MRI HEAD WITHOUT AND WITH CONTRAST TECHNIQUE: Multiplanar, multiecho pulse sequences of the brain and surrounding structures were obtained without and with intravenous contrast. CONTRAST:  19m MULTIHANCE GADOBENATE DIMEGLUMINE 529 MG/ML IV SOLN COMPARISON:  Prior CT from 02/08/2016. FINDINGS: Diffuse prominence of  the CSF containing spaces is compatible with generalized age-related cerebral atrophy. Mild confluent T2 hyperintensity within the periventricular white matter most likely related to chronic small vessel ischemic disease, within normal limits for patient age. Minimal small vessel type changes present within the pons as well. No abnormal foci of restricted diffusion to suggest acute ischemic infarct identified. Minimal high signal intensity within the central splenium on axial DWI sequence demonstrates no definite ADC correlate, and is favored to reflect artifact and/or T2 shine through.  Gray-white matter differentiation maintained. Major intracranial vascular flow voids are preserved. No acute or chronic intracranial hemorrhage. No areas of chronic infarction. No mass lesion, midline shift, or mass effect. Hippocampi are symmetric in size with normal morphology and signal intensity bilaterally. No abnormal enhancement. Diffuse ventricular prominence related to global parenchymal volume loss present without hydrocephalus. No extra-axial fluid collection para Craniocervical junction within normal limits. Scattered multilevel degenerative spondylolysis noted within the upper cervical spine without significant stenosis. Trace anterolisthesis of C3 on C4. Pituitary gland within normal limits. No acute abnormality about the orbits. Sequela prior bilateral lens extraction noted. Mild circumferential mucosal thickening within the right maxillary sinus. Paranasal sinuses are otherwise clear. No mastoid effusion. Inner ear structures grossly normal. Bone marrow signal intensity within normal limits. No scalp soft tissue abnormality. IMPRESSION: 1. No acute intracranial process. 2. Global age-related cerebral atrophy. Electronically Signed   By: Jeannine Boga M.D.   On: 01/16/2016 01:01   Dg Chest Portable 1 View  01/12/2016  CLINICAL DATA:  Cough and sob since yesterday EXAM: PORTABLE CHEST 1 VIEW COMPARISON:  CT, 04/21/2015.  Chest radiograph, 11/21/2014. FINDINGS: Cardiac silhouette is normal in size. No mediastinal or hilar masses or evidence of adenopathy. Mild lung base atelectasis similar to the prior studies. No evidence of pneumonia or edema. No pleural effusion or pneumothorax. Bony thorax demineralized but grossly intact. IMPRESSION: Acute cardiopulmonary disease. Electronically Signed   By: Lajean Manes M.D.   On: 01/12/2016 12:10     2-D echo LV EF: 60% -  65%  ------------------------------------------------------------------- Indications:   Syncope  780.2.  ------------------------------------------------------------------- History:  PMH:  Altered mental status. Atrial fibrillation. Risk factors: Diabetes mellitus.  ------------------------------------------------------------------- Study Conclusions  - Left ventricle: The cavity size was normal. Systolic function was normal. The estimated ejection fraction was in the range of 60% to 65%. Although no diagnostic regional wall motion abnormality was identified, this possibility cannot be completely excluded on the basis of this study. Doppler parameters are consistent with abnormal left ventricular relaxation (grade 1 diastolic dysfunction).   Filed Weights   01/13/16 0727 01/14/16 0611 01/15/16 0500  Weight: 83.689 kg (184 lb 8 oz) 84.006 kg (185 lb 3.2 oz) 82.9 kg (182 lb 12.2 oz)     Microbiology: No results found for this or any previous visit (from the past 240 hour(s)).     Blood Culture    Component Value Date/Time   SDES URINE, CLEAN CATCH 01/21/2015 0641   SPECREQUEST NONE 01/21/2015 0641   CULT  01/20/2015 1700    ESCHERICHIA COLI Performed at [place] 01/23/2015 FINAL 01/21/2015 0641      Labs: Results for orders placed or performed during the hospital encounter of 01/12/16 (from the past 48 hour(s))  Glucose, capillary     Status: Abnormal   Collection Time: 01/14/16 12:20 PM  Result Value Ref Range   Glucose-Capillary 180 (H) 65 - 99 mg/dL  Glucose, capillary     Status: Abnormal  Collection Time: 01/14/16  6:34 PM  Result Value Ref Range   Glucose-Capillary 148 (H) 65 - 99 mg/dL  Glucose, capillary     Status: Abnormal   Collection Time: 01/14/16 10:20 PM  Result Value Ref Range   Glucose-Capillary 198 (H) 65 - 99 mg/dL   Comment 1 Notify RN    Comment 2 Document in Chart   Glucose, capillary     Status: Abnormal   Collection Time: 01/15/16  4:03 AM  Result Value Ref Range   Glucose-Capillary 128  (H) 65 - 99 mg/dL   Comment 1 Notify RN    Comment 2 Document in Chart   Glucose, capillary     Status: Abnormal   Collection Time: 01/15/16  9:24 AM  Result Value Ref Range   Glucose-Capillary 153 (H) 65 - 99 mg/dL  Glucose, capillary     Status: Abnormal   Collection Time: 01/15/16 12:04 PM  Result Value Ref Range   Glucose-Capillary 218 (H) 65 - 99 mg/dL  Glucose, capillary     Status: Abnormal   Collection Time: 01/15/16  4:01 PM  Result Value Ref Range   Glucose-Capillary 169 (H) 65 - 99 mg/dL  Glucose, capillary     Status: Abnormal   Collection Time: 01/15/16  5:18 PM  Result Value Ref Range   Glucose-Capillary 161 (H) 65 - 99 mg/dL  CBC     Status: None   Collection Time: 01/16/16  5:26 AM  Result Value Ref Range   WBC 9.0 4.0 - 10.5 K/uL   RBC 4.90 4.22 - 5.81 MIL/uL   Hemoglobin 14.5 13.0 - 17.0 g/dL   HCT 44.6 39.0 - 52.0 %   MCV 91.0 78.0 - 100.0 fL   MCH 29.6 26.0 - 34.0 pg   MCHC 32.5 30.0 - 36.0 g/dL   RDW 13.0 11.5 - 15.5 %   Platelets 241 150 - 400 K/uL  Comprehensive metabolic panel     Status: Abnormal   Collection Time: 01/16/16  5:26 AM  Result Value Ref Range   Sodium 140 135 - 145 mmol/L   Potassium 3.9 3.5 - 5.1 mmol/L   Chloride 104 101 - 111 mmol/L   CO2 26 22 - 32 mmol/L   Glucose, Bld 130 (H) 65 - 99 mg/dL   BUN 15 6 - 20 mg/dL   Creatinine, Ser 0.86 0.61 - 1.24 mg/dL   Calcium 8.9 8.9 - 10.3 mg/dL   Total Protein 6.0 (L) 6.5 - 8.1 g/dL   Albumin 3.3 (L) 3.5 - 5.0 g/dL   AST 20 15 - 41 U/L   ALT 26 17 - 63 U/L   Alkaline Phosphatase 68 38 - 126 U/L   Total Bilirubin 0.7 0.3 - 1.2 mg/dL   GFR calc non Af Amer >60 >60 mL/min   GFR calc Af Amer >60 >60 mL/min    Comment: (NOTE) The eGFR has been calculated using the CKD EPI equation. This calculation has not been validated in all clinical situations. eGFR's persistently <60 mL/min signify possible Chronic Kidney Disease.    Anion gap 10 5 - 15  Glucose, capillary     Status: Abnormal    Collection Time: 01/16/16  7:47 AM  Result Value Ref Range   Glucose-Capillary 133 (H) 65 - 99 mg/dL  Urinalysis, Routine w reflex microscopic (not at [facility])     Status: Abnormal   Collection Time: 01/16/16 10:51 AM  Result Value Ref Range   Color, Urine YELLOW YELLOW  APPearance CLEAR CLEAR   Specific Gravity, Urine 1.037 (H) 1.005 - 1.030   pH 5.5 5.0 - 8.0   Glucose, UA NEGATIVE NEGATIVE mg/dL   Hgb urine dipstick NEGATIVE NEGATIVE   Bilirubin Urine SMALL (A) NEGATIVE   Ketones, ur 15 (A) NEGATIVE mg/dL   Protein, ur NEGATIVE NEGATIVE mg/dL   Nitrite NEGATIVE NEGATIVE   Leukocytes, UA NEGATIVE NEGATIVE    Comment: MICROSCOPIC NOT DONE ON URINES WITH NEGATIVE PROTEIN, BLOOD, LEUKOCYTES, NITRITE, OR GLUCOSE <1000 mg/dL.     Lipid Panel  No results found for: CHOL, TRIG, HDL, CHOLHDL, VLDL, LDLCALC, LDLDIRECT   Lab Results  Component Value Date   HGBA1C 6.9* 01/12/2016   HGBA1C 7.1* 01/20/2015     Lab Results  Component Value Date   CREATININE 0.86 01/16/2016     HPI :*82 y.o. male currently being worked up for [place] at [facility]. He recently had a MRI brain and lab work up but wife has not received the results. Per wife she had awoke at 0600 and told her husband to keep sleeping. She went to his bed at 0900 and noted he was not responding. He was breathing fast though. She called EMS and on arrival they could not get him to wake up and she believes they even "pricked his foot". He was brought to hospital and while in the hospital "all of a sudden woke up and was joking about using the siren on the ambulance". He has no recollection. EEG was obtained and was negative. CT head was also negative. TSH, Folate, UA, Mag and phos were all negative. MRI obtained at [facility] 2/9 was also normal. He was initially bradycardic on arrival to the hospital therefore beta blocker was held.On 01/14/2016, he developed an episode of atrial fibrillation with RVR which converted to sinus rhythm with  IV metoprolol. Cardiology was notified. There was no fall, syncope, incontinence or jerking per wife  HOSPITAL COURSE:    Syncope with collapse -Etiology unclear but differential includes orthostasis versus seizure activity versus arrhythmia (atrial fibrillation with rapid ventricular response and tachybradycardia syndrome is definitely a possibility); patient with nocturnal hypoxemia has sleep apnea and is noncompliant  Cont telemetry/Obs -Echocardiogram LV EF: 60% -  65% this admission,. Last echo in 2015 -EEG within normal limits -PT/OT evaluation-recommend home health Recent outpatient MRI brain 2/9 was negative , CT of the head negative, repeat MRI with and without contrast also within normal limits Discussed with Dr Einar Gip, he does not see any obvious arrhythmia at this time that can explain the patient's syncopal episode, therefore neurology consultation is being obtained, neurology recommended outpatient follow-up with neurology, no indication for aed at this time Patient will be set up for an event monitor by cardiology     Paroxysmal atrial fibrillation/tachybradycardia syndrome with rapid ventricular response today Do not know whether this was related to bradycardic episode leading to syncope and seizure or whether it is a primary neurologic issue as sick sinus syndrome is also highly likely.Restarted it beta blocker, at half previous dose, apparently patient did not have atrial fibrillation on telemetry, probably just had sinus tachycardia, cardiology to make further recommendations in the outpatient setting -Continue Edoxaban  (CHA2DS2-VASc Score is 3 with yearly risk of stroke of 3.2%. Bleeding risk is 1.02%/year) Patient to be set up with an event monitor today   DM w/o complication type II  -Diet controlled at home -Mildly hyperglycemic here so follow CBGs and provided SSI   OSA on CPAP -Continue nocturnal CPAP  Dementia According to the wife the patient  was recently started on Aricept for dementia   Blindness   Discharge Exam:    Blood pressure 132/81, pulse 69, temperature 98.2 F (36.8 C), temperature source Oral, resp. rate 20, weight 82.9 kg (182 lb 12.2 oz), SpO2 97 %.  General appearance: alert, cooperative, appears stated age and blind Lungs: clear to auscultation bilaterally Chest wall: no tenderness Heart: regular rate and rhythm, S1, S2 normal, no murmur, click, rub or gallop Abdomen: soft, non-tender; bowel sounds normal; no masses, no organomegaly Extremities: extremities normal, atraumatic, no cyanosis or edema Pulses: 2+ and symmetric Neurologic: Grossly normal, complete exam not done and gait not tested    Follow-up Information    Follow up with Adrian Prows, MD. Call in 2 days.   Specialty:  Cardiology   Why:  Our office will call you monday to come if for an event monitor. 01/16/16   Contact information:   [place] [place] [address] [phone]       Follow up with [facility].   Why:  shower stool to be delivered to room prior to discharge   Contact information:   [address] [place]  [address] [phone]       Follow up with [facility].   Why:  for home health PT. They will call in 1-2 days to set up first home health appointment.   Contact information:   [place] [address] [address] [phone]       Signed: Reyne Dumas 01/16/2016, 11:56 AM        Time spent >45 mins

## 2016-01-22 ENCOUNTER — Other Ambulatory Visit: Payer: Self-pay | Admitting: *Deleted

## 2016-01-22 DIAGNOSIS — I480 Paroxysmal atrial fibrillation: Secondary | ICD-10-CM

## 2016-01-22 DIAGNOSIS — J189 Pneumonia, unspecified organism: Secondary | ICD-10-CM

## 2016-01-22 DIAGNOSIS — G4733 Obstructive sleep apnea (adult) (pediatric): Secondary | ICD-10-CM

## 2016-01-22 DIAGNOSIS — R0902 Hypoxemia: Secondary | ICD-10-CM

## 2016-01-22 DIAGNOSIS — Z9989 Dependence on other enabling machines and devices: Principal | ICD-10-CM

## 2016-01-24 ENCOUNTER — Encounter (HOSPITAL_COMMUNITY): Payer: Self-pay | Admitting: Emergency Medicine

## 2016-01-24 ENCOUNTER — Emergency Department (HOSPITAL_COMMUNITY): Payer: Medicare Other

## 2016-01-24 ENCOUNTER — Emergency Department (HOSPITAL_COMMUNITY)
Admission: EM | Admit: 2016-01-24 | Discharge: 2016-01-25 | Disposition: A | Discharge status: Home / Self Care | Payer: Medicare Other | Attending: Emergency Medicine | Admitting: Emergency Medicine

## 2016-01-24 DIAGNOSIS — Z9981 Dependence on supplemental oxygen: Secondary | ICD-10-CM | POA: Insufficient documentation

## 2016-01-24 DIAGNOSIS — Z79899 Other long term (current) drug therapy: Secondary | ICD-10-CM | POA: Insufficient documentation

## 2016-01-24 DIAGNOSIS — E119 Type 2 diabetes mellitus without complications: Secondary | ICD-10-CM | POA: Diagnosis not present

## 2016-01-24 DIAGNOSIS — R55 Syncope and collapse: Secondary | ICD-10-CM

## 2016-01-24 DIAGNOSIS — G4733 Obstructive sleep apnea (adult) (pediatric): Secondary | ICD-10-CM | POA: Diagnosis not present

## 2016-01-24 DIAGNOSIS — R4182 Altered mental status, unspecified: Secondary | ICD-10-CM

## 2016-01-24 DIAGNOSIS — K219 Gastro-esophageal reflux disease without esophagitis: Secondary | ICD-10-CM | POA: Diagnosis not present

## 2016-01-24 DIAGNOSIS — F0390 Unspecified dementia without behavioral disturbance: Secondary | ICD-10-CM | POA: Insufficient documentation

## 2016-01-24 LAB — URINALYSIS, ROUTINE W REFLEX MICROSCOPIC
Bilirubin Urine: NEGATIVE
GLUCOSE, UA: NEGATIVE mg/dL
Hgb urine dipstick: NEGATIVE
KETONES UR: 15 mg/dL — AB
LEUKOCYTES UA: NEGATIVE
NITRITE: NEGATIVE
PH: 6.5 (ref 5.0–8.0)
Protein, ur: NEGATIVE mg/dL
SPECIFIC GRAVITY, URINE: 1.014 (ref 1.005–1.030)

## 2016-01-24 LAB — COMPREHENSIVE METABOLIC PANEL
ALBUMIN: 3.6 g/dL (ref 3.5–5.0)
ALK PHOS: 74 U/L (ref 38–126)
ALT: 29 U/L (ref 17–63)
ANION GAP: 10 (ref 5–15)
AST: 25 U/L (ref 15–41)
BUN: 11 mg/dL (ref 6–20)
CHLORIDE: 105 mmol/L (ref 101–111)
CO2: 25 mmol/L (ref 22–32)
Calcium: 9.2 mg/dL (ref 8.9–10.3)
Creatinine, Ser: 0.77 mg/dL (ref 0.61–1.24)
GFR calc non Af Amer: >60 mL/min (ref >60)
GLUCOSE: 149 mg/dL — AB (ref 65–99)
POTASSIUM: 4 mmol/L (ref 3.5–5.1)
SODIUM: 140 mmol/L (ref 135–145)
Total Bilirubin: 0.7 mg/dL (ref 0.3–1.2)
Total Protein: 6.2 g/dL — ABNORMAL LOW (ref 6.5–8.1)

## 2016-01-24 LAB — RAPID URINE DRUG SCREEN, HOSP PERFORMED
AMPHETAMINES: NOT DETECTED
BARBITURATES: NOT DETECTED
BENZODIAZEPINES: NOT DETECTED
Cocaine: NOT DETECTED
Opiates: NOT DETECTED
TETRAHYDROCANNABINOL: NOT DETECTED

## 2016-01-24 LAB — CBC WITH DIFFERENTIAL/PLATELET
BASOS PCT: 1 %
Basophils Absolute: 0.1 10*3/uL (ref 0.0–0.1)
Eosinophils Absolute: 0.2 10*3/uL (ref 0.0–0.7)
Eosinophils Relative: 3 %
HEMATOCRIT: 43.6 % (ref 39.0–52.0)
HEMOGLOBIN: 14.2 g/dL (ref 13.0–17.0)
LYMPHS ABS: 1.4 10*3/uL (ref 0.7–4.0)
LYMPHS PCT: 19 %
MCH: 29.8 pg (ref 26.0–34.0)
MCHC: 32.6 g/dL (ref 30.0–36.0)
MCV: 91.4 fL (ref 78.0–100.0)
MONO ABS: 1 10*3/uL (ref 0.1–1.0)
MONOS PCT: 13 %
NEUTROS ABS: 4.7 10*3/uL (ref 1.7–7.7)
NEUTROS PCT: 64 %
Platelets: 245 10*3/uL (ref 150–400)
RBC: 4.77 MIL/uL (ref 4.22–5.81)
RDW: 12.8 % (ref 11.5–15.5)
WBC: 7.3 10*3/uL (ref 4.0–10.5)

## 2016-01-24 LAB — PROTIME-INR
INR: 1.13 (ref 0.00–1.49)
Prothrombin Time: 14.7 seconds (ref 11.6–15.2)

## 2016-01-24 LAB — CBG MONITORING, ED: GLUCOSE-CAPILLARY: 141 mg/dL — AB (ref 65–99)

## 2016-01-24 LAB — I-STAT TROPONIN, ED: Troponin i, poc: 0 ng/mL (ref 0.00–0.08)

## 2016-01-24 MED ORDER — SODIUM CHLORIDE 0.9 % IV SOLN
INTRAVENOUS | Status: DC
  Administered 2016-01-24: 13:00:00 via INTRAVENOUS

## 2016-01-24 NOTE — ED Notes (Signed)
Patient transported to MRI 

## 2016-01-24 NOTE — Consult Note (Signed)
NEURO HOSPITALIST CONSULT NOTE   Requestig physician: Dr. Donnald Garre   Reason for Consult: AMS   History obtained from:  Patient     HPI:                                                                                                                                          Richard Lawson is an 82 y.o. male who was just here for similar presentation. At that time he had a EEG and MRI which were both negative. He has a primary neurologist at [facility] and wife admits she has not called for a follow up although she was told at discharge to get him seen within 2 weeks. Today she took a shower and upon getting out she noted he was not responsive but no jerking was noted. She states she sternal rubbed him but when asked to show me what she did she barely rubbed his chest. She states EMS could not get him to respond. HE is alert and oriented at this time. Holter monitor did not capture any abnormal rhythm.   Past Medical History  Diagnosis Date  . Retinitis pigmentosa of both eyes   . GERD (gastroesophageal reflux disease)   . Paroxysmal a-fib (HCC)   . Type II diabetes mellitus (HCC)   . Dementia     "not from CVA; unclear if it's Alzheimer's dementia at time" (01/12/2016)  . Lung infection 01/2015  . OSA on CPAP   . On home oxygen therapy     w/CPAP  . History of hiatal hernia   . Unresponsiveness 01/12/2016    "at home this am til he got here to ER; probably lasted ~ 2 hours"     Past Surgical History  Procedure Laterality Date  . Orif radial head / neck fracture Right 10/2009  . Fracture surgery    . Cataract extraction      "don't remember which side"  . Transurethral resection of prostate  02/2010    Family History  Problem Relation Age of Onset  . Lung cancer Father   . COPD Brother      Social History:  reports that he has never smoked. He has never used smokeless tobacco. He reports that he does not drink alcohol or use illicit drugs.  Allergies  Allergen  Reactions  . Metformin     Altered Mental Status    MEDICATIONS:  Current Facility-Administered Medications  Medication Dose Route Frequency Provider Last Rate Last Dose  . 0.9 %  sodium chloride infusion   Intravenous Continuous Anselm Pancoast, PA-C 125 mL/hr at 01/24/16 1318     Current Outpatient Prescriptions  Medication Sig Dispense Refill  . Cyanocobalamin (VITAMIN B-12) 2500 MCG SUBL Take 2,500 mcg by mouth daily.    Marland Kitchen edoxaban (SAVAYSA) 60 MG TABS tablet Take 60 mg by mouth daily.    . metoprolol tartrate (LOPRESSOR) 25 MG tablet Take 0.5 tablets (12.5 mg total) by mouth 2 (two) times daily. 60 tablet 0  . OVER THE COUNTER MEDICATION Take 325 mg by mouth daily. Megazyme        ROS:                                                                                                                                       History obtained from the patient  General ROS: negative for - chills, fatigue, fever, night sweats, weight gain or weight loss Psychological ROS: negative for - behavioral disorder, hallucinations, memory difficulties, mood swings or suicidal ideation Ophthalmic ROS: negative for - blurry vision, double vision, eye pain or loss of vision ENT ROS: negative for - epistaxis, nasal discharge, oral lesions, sore throat, tinnitus or vertigo Allergy and Immunology ROS: negative for - hives or itchy/watery eyes Hematological and Lymphatic ROS: negative for - bleeding problems, bruising or swollen lymph nodes Endocrine ROS: negative for - galactorrhea, hair pattern changes, polydipsia/polyuria or temperature intolerance Respiratory ROS: negative for - cough, hemoptysis, shortness of breath or wheezing Cardiovascular ROS: negative for - chest pain, dyspnea on exertion, edema or irregular heartbeat Gastrointestinal ROS: negative for - abdominal pain, diarrhea,  hematemesis, nausea/vomiting or stool incontinence Genito-Urinary ROS: negative for - dysuria, hematuria, incontinence or urinary frequency/urgency Musculoskeletal ROS: negative for - joint swelling or muscular weakness Neurological ROS: as noted in HPI Dermatological ROS: negative for rash and skin lesion changes   Blood pressure 125/69, pulse 69, temperature 97.9 F (36.6 C), temperature source Oral, resp. rate 21, SpO2 96 %.   Neurologic Examination:                                                                                                      HEENT-  Normocephalic, no lesions, without obvious abnormality.  Normal external eye and conjunctiva.  Normal TM's bilaterally.  Normal auditory canals and external ears. Normal external nose, mucus membranes and septum.  Normal pharynx. Cardiovascular- S1, S2  normal, pulses palpable throughout   Lungs- chest clear, no wheezing, rales, normal symmetric air entry Abdomen- normal findings: bowel sounds normal Extremities- no edema Lymph-no adenopathy palpable Musculoskeletal-no joint tenderness, deformity or swelling Skin-warm and dry, no hyperpigmentation, vitiligo, or suspicious lesions  Neurological Examination Mental Status: Alert, oriented, thought content appropriate.  Speech fluent without evidence of aphasia.  Able to follow 3 step commands without difficulty. Cranial Nerves: II: blind bilaterally, pupils minimally reacctive round, reactive to light  III,IV, VI: ptosis not present, extra-ocular motions intact bilaterally V,VII: smile symmetric, facial light touch sensation normal bilaterally VIII: hearing normal bilaterally IX,X: uvula rises symmetrically XI: bilateral shoulder shrug XII: midline tongue extension Motor: Right : Upper extremity   5/5    Left:     Upper extremity   5/5  Lower extremity   5/5     Lower extremity   5/5 Tone and bulk:normal tone throughout; no atrophy noted Sensory: Pinprick and light touch intact  throughout, bilaterally Deep Tendon Reflexes: 1+ and symmetric throughout UE and none in  LE Plantars: Right: downgoing   Left: downgoing Cerebellar: No ataxia noted Gait: not tested      Lab Results: Basic Metabolic Panel:  Recent Labs Lab 01/24/16 1302  NA 140  K 4.0  CL 105  CO2 25  GLUCOSE 149*  BUN 11  CREATININE 0.77  CALCIUM 9.2    Liver Function Tests:  Recent Labs Lab 01/24/16 1302  AST 25  ALT 29  ALKPHOS 74  BILITOT 0.7  PROT 6.2*  ALBUMIN 3.6   No results for input(s): LIPASE, AMYLASE in the last 168 hours. No results for input(s): AMMONIA in the last 168 hours.  CBC:  Recent Labs Lab 01/24/16 1302  WBC 7.3  NEUTROABS 4.7  HGB 14.2  HCT 43.6  MCV 91.4  PLT 245    Cardiac Enzymes: No results for input(s): CKTOTAL, CKMB, CKMBINDEX, TROPONINI in the last 168 hours.  Lipid Panel: No results for input(s): CHOL, TRIG, HDL, CHOLHDL, VLDL, LDLCALC in the last 168 hours.  CBG:  Recent Labs Lab 01/24/16 1318  GLUCAP 141*    Microbiology: Results for orders placed or performed during the hospital encounter of 01/20/15  Blood culture (routine x 2)     Status: None   Collection Time: 01/20/15  4:50 PM  Result Value Ref Range Status   Specimen Description BLOOD RIGHT HAND  Final   Special Requests BOTTLES DRAWN AEROBIC AND ANAEROBIC 5CC EACH  Final   Culture   Final    NO GROWTH 5 DAYS Performed at [facility]    Report Status 01/26/2015 FINAL  Final  Blood culture (routine x 2)     Status: None   Collection Time: 01/20/15  4:55 PM  Result Value Ref Range Status   Specimen Description BLOOD LEFT ARM  Final   Special Requests BOTTLES DRAWN AEROBIC AND ANAEROBIC 10CC EACH  Final   Culture   Final    NO GROWTH 5 DAYS Performed at [facility]    Report Status 01/26/2015 FINAL  Final  Culture, Urine     Status: None   Collection Time: 01/20/15  5:00 PM  Result Value Ref Range Status   Specimen Description URINE,  CLEAN CATCH  Final   Special Requests NONE  Final   Colony Count   Final    30,000 COLONIES/ML Performed at [facility]    Culture   Final    ESCHERICHIA COLI Performed at [facility]    Report Status 01/23/2015 FINAL  Final   Organism ID, Bacteria ESCHERICHIA COLI  Final      Susceptibility   Escherichia coli - MIC*    AMPICILLIN <=2 SENSITIVE Sensitive     CEFAZOLIN <=4 SENSITIVE Sensitive     CEFTRIAXONE <=1 SENSITIVE Sensitive     CIPROFLOXACIN <=0.25 SENSITIVE Sensitive     GENTAMICIN <=1 SENSITIVE Sensitive     LEVOFLOXACIN <=0.12 SENSITIVE Sensitive     NITROFURANTOIN <=16 SENSITIVE Sensitive     TOBRAMYCIN <=1 SENSITIVE Sensitive     TRIMETH/SULFA <=20 SENSITIVE Sensitive     PIP/TAZO <=4 SENSITIVE Sensitive     * ESCHERICHIA COLI    Coagulation Studies:  Recent Labs  01/24/16 1302  LABPROT 14.7  INR 1.13    Imaging: Dg Chest 2 View  01/24/2016  CLINICAL DATA:  Syncope, passed out in kitchen, history type II diabetes mellitus, paroxysmal atrial fibrillation EXAM: CHEST  2 VIEW COMPARISON:  01/14/2016 FINDINGS: Upper normal size of cardiac silhouette. Atherosclerotic calcification aorta. Mediastinal contours and pulmonary vascularity normal. Bibasilar atelectasis. Chronic elevation of RIGHT diaphragm. No acute infiltrate, pleural effusion or pneumothorax. Bones demineralized. IMPRESSION: Bibasilar atelectasis greater on RIGHT. Electronically Signed   By: Ulyses SouthwardMark  Boles M.D.   On: 01/24/2016 13:48   Ct Head Wo Contrast  01/24/2016  CLINICAL DATA:  Syncope with concern for unwitnessed fall. Dysphagia. EXAM: CT HEAD WITHOUT CONTRAST CT CERVICAL SPINE WITHOUT CONTRAST TECHNIQUE: Multidetector CT imaging of the head and cervical spine was performed following the standard protocol without intravenous contrast. Multiplanar CT image reconstructions of the cervical spine were also generated. COMPARISON:  Cervical spine CT October 28, 2009; head CT January 12, 2016;  brain MRI January 15, 2016 FINDINGS: CT HEAD FINDINGS There is moderate diffuse atrophy. There is no intracranial mass, hemorrhage, extra-axial fluid collection, or midline shift. There is mild patchy small vessel disease in the centra semiovale bilaterally. There is a new focal area of decreased attenuation in the posterior mid right pons seen on axial slice 11 series 3, concerning for recent/acute infarct in this area. No other evidence of acute infarct. Bony calvarium appears intact. The mastoid air cells are clear. There is opacification containing calcification in the inferior right maxillary antrum. No intraorbital lesions are identified. CT CERVICAL SPINE FINDINGS Bones are diffusely osteoporotic. There is no demonstrable fracture. There is slight anterolisthesis of C3 on C4. There is slight anterolisthesis of C7 on T1. There is minimal retrolisthesis of C5 on C6. There is minimal retrolisthesis of C6 on C7. These changes are felt to be due to underlying spondylosis. Prevertebral soft tissues and predental space regions are normal. There is moderately severe disc space narrowing at C6-7. There is moderate narrowing at C3-4, C4-5, C5-6, and C7-T1. There is bony hypertrophy at multiple levels bilaterally and facet regions. There is exit foraminal narrowing due to bony hypertrophy at C2-3 on the left, at C3-4 on the left, at C4-5 on the right, at C5-6 on the right, and at C6-7 on the right. There is no frank disc extrusion or high-grade stenosis. There is distal right vertebral artery calcification. There is calcification in each carotid artery. IMPRESSION: CT head: Decreased attenuation involving a portion of the posterior mid right pons, concerning for recent/acute infarct. No other evidence of acute infarct. There is atrophy with mild periventricular small vessel disease. No hemorrhage or mass effect. Chronic sinusitis in the inferior right maxillary antrum with calcification. Question fungal colonization in  this  area given the calcification. Moderate diffuse atrophy is stable. CT cervical spine: Diffuse osteoporosis. No acute fracture evident. Mild spondylolisthesis at multiple levels due to underlying spondylosis. Multilevel arthropathy as summarized above. Scattered foci of arterial vascular calcification. Electronically Signed   By: Bretta Bang III M.D.   On: 01/24/2016 13:44   Ct Cervical Spine Wo Contrast  01/24/2016  CLINICAL DATA:  Syncope with concern for unwitnessed fall. Dysphagia. EXAM: CT HEAD WITHOUT CONTRAST CT CERVICAL SPINE WITHOUT CONTRAST TECHNIQUE: Multidetector CT imaging of the head and cervical spine was performed following the standard protocol without intravenous contrast. Multiplanar CT image reconstructions of the cervical spine were also generated. COMPARISON:  Cervical spine CT October 28, 2009; head CT January 12, 2016; brain MRI January 15, 2016 FINDINGS: CT HEAD FINDINGS There is moderate diffuse atrophy. There is no intracranial mass, hemorrhage, extra-axial fluid collection, or midline shift. There is mild patchy small vessel disease in the centra semiovale bilaterally. There is a new focal area of decreased attenuation in the posterior mid right pons seen on axial slice 11 series 3, concerning for recent/acute infarct in this area. No other evidence of acute infarct. Bony calvarium appears intact. The mastoid air cells are clear. There is opacification containing calcification in the inferior right maxillary antrum. No intraorbital lesions are identified. CT CERVICAL SPINE FINDINGS Bones are diffusely osteoporotic. There is no demonstrable fracture. There is slight anterolisthesis of C3 on C4. There is slight anterolisthesis of C7 on T1. There is minimal retrolisthesis of C5 on C6. There is minimal retrolisthesis of C6 on C7. These changes are felt to be due to underlying spondylosis. Prevertebral soft tissues and predental space regions are normal. There is moderately severe disc  space narrowing at C6-7. There is moderate narrowing at C3-4, C4-5, C5-6, and C7-T1. There is bony hypertrophy at multiple levels bilaterally and facet regions. There is exit foraminal narrowing due to bony hypertrophy at C2-3 on the left, at C3-4 on the left, at C4-5 on the right, at C5-6 on the right, and at C6-7 on the right. There is no frank disc extrusion or high-grade stenosis. There is distal right vertebral artery calcification. There is calcification in each carotid artery. IMPRESSION: CT head: Decreased attenuation involving a portion of the posterior mid right pons, concerning for recent/acute infarct. No other evidence of acute infarct. There is atrophy with mild periventricular small vessel disease. No hemorrhage or mass effect. Chronic sinusitis in the inferior right maxillary antrum with calcification. Question fungal colonization in this area given the calcification. Moderate diffuse atrophy is stable. CT cervical spine: Diffuse osteoporosis. No acute fracture evident. Mild spondylolisthesis at multiple levels due to underlying spondylosis. Multilevel arthropathy as summarized above. Scattered foci of arterial vascular calcification. Electronically Signed   By: Bretta Bang III M.D.   On: 01/24/2016 13:44     Assessment and plan per attending neurologist  Felicie Morn PA-C [facility] [phone]  01/24/2016, 2:37 PM   Assessment/Plan: 82 YO male with transient AMS. Currently back to his baseline. CT head shows possible acute infarct in Pons. Cannot rule out seizure versus CVA.  Recommend 1) MRI brain without contrast 2) EEG 3) Will make further decision on AED after tests.

## 2016-01-24 NOTE — ED Provider Notes (Signed)
CSN: [id number]     Arrival date & time 01/24/16  1250 History   First MD Initiated Contact with Patient 01/24/16 1258     Chief Complaint  Patient presents with  . Altered Mental Status     (Consider location/radiation/quality/duration/timing/severity/associated sxs/prior Treatment) HPI   Level V caveat applies due to dementia.  Richard Lawson is a 82 y.o. male, with a history of Dementia, DM, paroxysmal A. fib, presenting to the ED with Report of syncopal episode and unresponsiveness. EMS reports that when they arrived on scene the patient was completely unresponsive, but breathing normally with a normal pulse rate and blood pressure. CBG is in the 170s. Patient was unresponsive for 15 minutes and then slowly regained consciousness and began to follow commands. Patient's family on scene reportedly states that the patient was last seen normal at 8:30 this morning. When asked what happened, pt states, "I was just standing around and then I passed out." Patient was noted to be drooling of the cars of his mouth. Patient states that this is not normal for him. Patient is blind in both eyes. Patient denies nausea/vomiting, fever/chills, cough, shortness of breath, chest pain, neck/back pain, or any other complaints.    Past Medical History  Diagnosis Date  . Retinitis pigmentosa of both eyes   . GERD (gastroesophageal reflux disease)   . Paroxysmal a-fib (HCC)   . Type II diabetes mellitus (HCC)   . Dementia     "not from CVA; unclear if it's Alzheimer's dementia at time" (01/12/2016)  . Lung infection 01/2015  . OSA on CPAP   . On home oxygen therapy     w/CPAP  . History of hiatal hernia   . Unresponsiveness 01/12/2016    "at home this am til he got here to ER; probably lasted ~ 2 hours"    Past Surgical History  Procedure Laterality Date  . Orif radial head / neck fracture Right 10/2009  . Fracture surgery    . Cataract extraction      "don't remember which side"  . Transurethral  resection of prostate  02/2010   Family History  Problem Relation Age of Onset  . Lung cancer Father   . COPD Brother    Social History  Substance Use Topics  . Smoking status: Never Smoker   . Smokeless tobacco: Never Used  . Alcohol Use: No    Review of Systems  Constitutional: Negative for fever and chills.  HENT: Positive for drooling.   Respiratory: Negative for shortness of breath.   Cardiovascular: Negative for chest pain.  Gastrointestinal: Negative for nausea, vomiting, abdominal pain, diarrhea and constipation.  Genitourinary: Negative for dysuria.  Neurological: Positive for syncope. Negative for dizziness, tremors, seizures, facial asymmetry, speech difficulty, weakness, light-headedness and headaches.      Allergies  Metformin  Home Medications   Prior to Admission medications   Medication Sig Start Date End Date Taking? Authorizing Provider  Cyanocobalamin (VITAMIN B-12) 2500 MCG SUBL Take 2,500 mcg by mouth daily.   Yes Historical Provider, MD  donepezil (ARICEPT) 5 MG tablet Take 5 mg by mouth every evening. 12/30/15  Yes Historical Provider, MD  edoxaban (SAVAYSA) 60 MG TABS tablet Take 60 mg by mouth daily.   Yes Historical Provider, MD  metoprolol tartrate (LOPRESSOR) 25 MG tablet Take 0.5 tablets (12.5 mg total) by mouth 2 (two) times daily. Patient taking differently: Take 25 mg by mouth 2 (two) times daily.  01/16/16  Yes [name], MD  OVER THE COUNTER MEDICATION Take 325 mg by mouth daily. Megazyme   Yes Historical Provider, MD   BP 125/69 mmHg  Pulse 69  Temp(Src) 97.9 F (36.6 C) (Oral)  Resp 21  SpO2 96% Physical Exam  Constitutional: He is oriented to person, place, and time. He appears well-developed and well-nourished. No distress.  HENT:  Head: Normocephalic and atraumatic.  Patient is drooling from the corners of his mouth.  Eyes: Conjunctivae are normal.  Unable to test EOMs due to blindness.  Neck: Normal range of motion. Neck  supple.  Cardiovascular: Normal rate, regular rhythm, normal heart sounds and intact distal pulses.   Pulmonary/Chest: Effort normal and breath sounds normal. No respiratory distress.  Abdominal: Soft. Bowel sounds are normal. There is no tenderness. There is no guarding.  Musculoskeletal: He exhibits no edema or tenderness.  Full ROM in all extremities and spine. No paraspinal tenderness.   Lymphadenopathy:    He has no cervical adenopathy.  Neurological: He is alert and oriented to person, place, and time. He has normal reflexes.  No sensory deficits. Strength 5/5 in all extremities. Coordination intact. Cranial nerves V, VII-XII grossly intact. No facial droop.   Skin: Skin is warm and dry. He is not diaphoretic.  Psychiatric: He has a normal mood and affect. His behavior is normal.  Nursing note and vitals reviewed.   ED Course  Procedures (including critical care time) Labs Review Labs Reviewed  COMPREHENSIVE METABOLIC PANEL - Abnormal; Notable for the following:    Glucose, Bld 149 (*)    Total Protein 6.2 (*)    All other components within normal limits  CBG MONITORING, ED - Abnormal; Notable for the following:    Glucose-Capillary 141 (*)    All other components within normal limits  URINE CULTURE  CBC WITH DIFFERENTIAL/PLATELET  PROTIME-INR  URINALYSIS, ROUTINE W REFLEX MICROSCOPIC (NOT AT [facility])  URINE RAPID DRUG SCREEN, HOSP PERFORMED  POCT CBG (FASTING - GLUCOSE)-MANUAL ENTRY  I-STAT TROPOININ, ED    Imaging Review Dg Chest 2 View  01/24/2016  CLINICAL DATA:  Syncope, passed out in kitchen, history type II diabetes mellitus, paroxysmal atrial fibrillation EXAM: CHEST  2 VIEW COMPARISON:  01/14/2016 FINDINGS: Upper normal size of cardiac silhouette. Atherosclerotic calcification aorta. Mediastinal contours and pulmonary vascularity normal. Bibasilar atelectasis. Chronic elevation of RIGHT diaphragm. No acute infiltrate, pleural effusion or pneumothorax. Bones  demineralized. IMPRESSION: Bibasilar atelectasis greater on RIGHT. Electronically Signed   By: Ulyses Southward M.D.   On: 01/24/2016 13:48   Ct Head Wo Contrast  01/24/2016  CLINICAL DATA:  Syncope with concern for unwitnessed fall. Dysphagia. EXAM: CT HEAD WITHOUT CONTRAST CT CERVICAL SPINE WITHOUT CONTRAST TECHNIQUE: Multidetector CT imaging of the head and cervical spine was performed following the standard protocol without intravenous contrast. Multiplanar CT image reconstructions of the cervical spine were also generated. COMPARISON:  Cervical spine CT October 28, 2009; head CT January 12, 2016; brain MRI January 15, 2016 FINDINGS: CT HEAD FINDINGS There is moderate diffuse atrophy. There is no intracranial mass, hemorrhage, extra-axial fluid collection, or midline shift. There is mild patchy small vessel disease in the centra semiovale bilaterally. There is a new focal area of decreased attenuation in the posterior mid right pons seen on axial slice 11 series 3, concerning for recent/acute infarct in this area. No other evidence of acute infarct. Bony calvarium appears intact. The mastoid air cells are clear. There is opacification containing calcification in the inferior right maxillary antrum. No intraorbital lesions are  identified. CT CERVICAL SPINE FINDINGS Bones are diffusely osteoporotic. There is no demonstrable fracture. There is slight anterolisthesis of C3 on C4. There is slight anterolisthesis of C7 on T1. There is minimal retrolisthesis of C5 on C6. There is minimal retrolisthesis of C6 on C7. These changes are felt to be due to underlying spondylosis. Prevertebral soft tissues and predental space regions are normal. There is moderately severe disc space narrowing at C6-7. There is moderate narrowing at C3-4, C4-5, C5-6, and C7-T1. There is bony hypertrophy at multiple levels bilaterally and facet regions. There is exit foraminal narrowing due to bony hypertrophy at C2-3 on the left, at C3-4 on the left,  at C4-5 on the right, at C5-6 on the right, and at C6-7 on the right. There is no frank disc extrusion or high-grade stenosis. There is distal right vertebral artery calcification. There is calcification in each carotid artery. IMPRESSION: CT head: Decreased attenuation involving a portion of the posterior mid right pons, concerning for recent/acute infarct. No other evidence of acute infarct. There is atrophy with mild periventricular small vessel disease. No hemorrhage or mass effect. Chronic sinusitis in the inferior right maxillary antrum with calcification. Question fungal colonization in this area given the calcification. Moderate diffuse atrophy is stable. CT cervical spine: Diffuse osteoporosis. No acute fracture evident. Mild spondylolisthesis at multiple levels due to underlying spondylosis. Multilevel arthropathy as summarized above. Scattered foci of arterial vascular calcification. Electronically Signed   By: Bretta Bang III M.D.   On: 01/24/2016 13:44   Ct Cervical Spine Wo Contrast  01/24/2016  CLINICAL DATA:  Syncope with concern for unwitnessed fall. Dysphagia. EXAM: CT HEAD WITHOUT CONTRAST CT CERVICAL SPINE WITHOUT CONTRAST TECHNIQUE: Multidetector CT imaging of the head and cervical spine was performed following the standard protocol without intravenous contrast. Multiplanar CT image reconstructions of the cervical spine were also generated. COMPARISON:  Cervical spine CT October 28, 2009; head CT January 12, 2016; brain MRI January 15, 2016 FINDINGS: CT HEAD FINDINGS There is moderate diffuse atrophy. There is no intracranial mass, hemorrhage, extra-axial fluid collection, or midline shift. There is mild patchy small vessel disease in the centra semiovale bilaterally. There is a new focal area of decreased attenuation in the posterior mid right pons seen on axial slice 11 series 3, concerning for recent/acute infarct in this area. No other evidence of acute infarct. Bony calvarium appears  intact. The mastoid air cells are clear. There is opacification containing calcification in the inferior right maxillary antrum. No intraorbital lesions are identified. CT CERVICAL SPINE FINDINGS Bones are diffusely osteoporotic. There is no demonstrable fracture. There is slight anterolisthesis of C3 on C4. There is slight anterolisthesis of C7 on T1. There is minimal retrolisthesis of C5 on C6. There is minimal retrolisthesis of C6 on C7. These changes are felt to be due to underlying spondylosis. Prevertebral soft tissues and predental space regions are normal. There is moderately severe disc space narrowing at C6-7. There is moderate narrowing at C3-4, C4-5, C5-6, and C7-T1. There is bony hypertrophy at multiple levels bilaterally and facet regions. There is exit foraminal narrowing due to bony hypertrophy at C2-3 on the left, at C3-4 on the left, at C4-5 on the right, at C5-6 on the right, and at C6-7 on the right. There is no frank disc extrusion or high-grade stenosis. There is distal right vertebral artery calcification. There is calcification in each carotid artery. IMPRESSION: CT head: Decreased attenuation involving a portion of the posterior mid right pons, concerning  for recent/acute infarct. No other evidence of acute infarct. There is atrophy with mild periventricular small vessel disease. No hemorrhage or mass effect. Chronic sinusitis in the inferior right maxillary antrum with calcification. Question fungal colonization in this area given the calcification. Moderate diffuse atrophy is stable. CT cervical spine: Diffuse osteoporosis. No acute fracture evident. Mild spondylolisthesis at multiple levels due to underlying spondylosis. Multilevel arthropathy as summarized above. Scattered foci of arterial vascular calcification. Electronically Signed   By: Bretta BangWilliam  Woodruff III M.D.   On: 01/24/2016 13:44   I have personally reviewed and evaluated these images and lab results as part of my medical  decision-making.   EKG Interpretation None      ED ECG REPORT   Date: 01/24/2016  Rate: 74  Rhythm: Sinus rhythm with PACs  QRS Axis: normal  Intervals: normal  ST/T Wave abnormalities: normal  Conduction Disutrbances:none  Narrative Interpretation:   Old EKG Reviewed: unchanged  I have personally reviewed the EKG tracing and agree with the computerized printout as noted.  MDM   Final diagnoses:  Syncope, unspecified syncope type    Alease Medinaonald R Dion presents with syncope and unresponsiveness that occurred about an hour ago. Last seen normal at 8:30 this morning.  Findings and plan of care discussed with Arby BarretteMarcy Pfeiffer, MD. Dr. Donnald GarrePfeiffer personally evaluated and examined this patient.   This patient's presentation is suggestive of a possible seizure vs complications from dementia. Low suspicion for stroke given that the patient went completely unresponsive, but then regained full consciousness minutes later.  Interrogated the patient's external heart monitor. Latest event recorded was sinus arrhythmia yesterday, March 14 at around 12 PM. Upon reassessment, patient's drooling has resolved. The rest of the neuro exam remains the same as initial findings. 2:20 PM Spoke with Dr. Lavon PaganiniNandigam, neurologist, who agreed to come see the patient and requested a MRI of the brain. No further instructions. 3:49 PM End of shift patient care handoff report given to Amado NashJennifer Irick, MD. Plan: Await MRI results and subsequent neurology assessment. If neurology is comfortable with the patient's discharge, then discharge per their instructions.   Filed Vitals:   01/24/16 1258 01/24/16 1315  BP: 136/85 125/69  Pulse:  69  Temp: 97.9 F (36.6 C)   TempSrc: Oral   Resp: 15 21  SpO2: 99% 96%       Anselm PancoastShawn C Joy, PA-C 01/24/16 1551  Arby BarretteMarcy Pfeiffer, MD 01/26/16 [phone]

## 2016-01-24 NOTE — ED Notes (Signed)
Pt from home, family called EMS for pt being unresponsive. Pt breathing when EMS arrived. Unresponsive for EMS initially. Pt intermittently following commands.  BP 138/80, CBG 200, Sinus rhythm HR 80's, 98% on room air. Denies CP, HA. Pt had similar episode last week and was released after neuro and cardiac workup negative. Pt complaining of having hard time swallowing.

## 2016-01-24 NOTE — Progress Notes (Signed)
STAT EEG completed; results pending. 

## 2016-01-24 NOTE — ED Provider Notes (Signed)
Care of pt assumed from [place] Joy, [place] at 1500. Please see his note for full history, physical exam, and medical decision making up to this point.   In brief, the patient is an 82 year old male who presents status post a syncopal episode. Patient was noted to have a similar episode 10 days ago. At that time he was hospitalized and had an extensive workup including MRI, echo, and Holter monitor. He was discharged home when no underlying etiology was found and then had a similar episode today.  CT scan here concerning for a small acute infarct in the pons. Neurology has recommended MRI/MRA for further evaluation as well as emergent EEG. This is been ordered. Currently waiting the results. Holter monitor revealed no acute events recorded during the day today. Doubt underlying cardiac etiology as the cause of the patient's episode.  MDM continued: MRI/MRA with no evidence of acute ischemia. Patient is neurologically intact and at his cognitive baseline. Neurology is recommending starting 250 mg of Keppra twice daily until EEG has been read and interpreted. They're recommending follow-up with him in clinic in 1 week. Patient reports that he feels at his baseline and he is stable for discharge home with neurology follow-up.    Kasara Schomer Ernestina PennaBrunno Patton Swisher, MD 01/25/16 [id number]  Alvira MondayErin Schlossman, MD 01/25/16 1346

## 2016-01-24 NOTE — ED Notes (Signed)
Notified [employer] operational officersecretary to please call company to interrogate pt's external heart monitor per order

## 2016-01-25 MED ORDER — LEVETIRACETAM 250 MG PO TABS: 500.0000 mg | ORAL_TABLET | Freq: Two times a day (BID) | ORAL | Status: DC

## 2016-01-25 NOTE — Discharge Instructions (Signed)

## 2016-01-25 NOTE — Procedures (Signed)
History: Richard Lawson is an 82 y.o. male patient with altered mental status. Routine inpatient EEG was performed for further evaluation.   Patient Active Problem List   Diagnosis Date Noted  . Protein-calorie malnutrition, severe 01/14/2016  . Altered mental status   . Syncope 01/12/2016  . Bradycardia 01/12/2016  . Dementia 01/12/2016  . Syncope and collapse 01/12/2016  . Abnormal chest x-ray with multiple nodules 04/19/2015  . OSA on CPAP 04/19/2015  . Blindness 04/19/2015  . Obstructive sleep apnea syndrome 03/06/2015  . Hyperglycemia 01/20/2015  . Hyponatremia 01/20/2015  . DM w/o complication type II (HCC) 01/20/2015  . Paroxysmal atrial fibrillation (HCC) 12/23/2014  . Hypoxemia requiring supplemental oxygen 12/23/2014  . Primary snoring 12/23/2014  . Retinitis pigmentosa, both eyes 12/23/2014    No current facility-administered medications for this encounter.  Current outpatient prescriptions:  .  Cyanocobalamin (VITAMIN B-12) 2500 MCG SUBL, Take 2,500 mcg by mouth daily., Disp: , Rfl:  .  donepezil (ARICEPT) 5 MG tablet, Take 5 mg by mouth every evening., Disp: , Rfl: 2 .  edoxaban (SAVAYSA) 60 MG TABS tablet, Take 60 mg by mouth daily., Disp: , Rfl:  .  metoprolol tartrate (LOPRESSOR) 25 MG tablet, Take 0.5 tablets (12.5 mg total) by mouth 2 (two) times daily. (Patient taking differently: Take 25 mg by mouth 2 (two) times daily. ), Disp: 60 tablet, Rfl: 0 .  OVER THE COUNTER MEDICATION, Take 325 mg by mouth daily. Megazyme, Disp: , Rfl:  .  levETIRAcetam (KEPPRA) 250 MG tablet, Take 2 tablets (500 mg total) by mouth 2 (two) times daily., Disp: 60 tablet, Rfl: 0   Introduction:  This is a 19 channel routine scalp EEG performed at the bedside with bipolar and monopolar montages arranged in accordance to the international 10/20 system of electrode placement. One channel was dedicated to EKG recording.   Findings:  The background rhythm was normal 9-10 Hz alpha . No  definite evidence of abnormal epileptiform discharges or electrographic seizures were noted during this recording.   Impression:  Unremarkable awake and drowsy routine inpatient EEG. Clinical correlation is recommended .

## 2016-01-25 NOTE — Progress Notes (Addendum)
ED CM consulted to discuss transitional care ED CM met with patient and wife at bedside, wife wanted to discuss reason for patient not being admitted to hospital. CM explained to patient and wife that he does not meet criteria for admission. Patient is active with [facility] services with [place].  Wife verbalizes her frustration about having to get patient home in the cold weather at night. CM voiced understanding her frustration, offered to call someone for her to assist her after she gets home. Wife stated she does not drive at night, CM offered to assist her with calling a taxi and making arrangements to pick her car up from the hospital in the morning. However, wife states, she would call a friend. CM informed Callie RN of patient and wife's decision if something should change and my assistance is needed I remain available.

## 2016-01-26 LAB — URINE CULTURE

## 2016-02-14 ENCOUNTER — Emergency Department (HOSPITAL_COMMUNITY): Payer: Medicare Other

## 2016-02-14 ENCOUNTER — Emergency Department (HOSPITAL_COMMUNITY)
Admission: EM | Admit: 2016-02-14 | Discharge: 2016-02-14 | Disposition: A | Discharge status: Home / Self Care | Payer: Medicare Other | Attending: Emergency Medicine | Admitting: Emergency Medicine

## 2016-02-14 DIAGNOSIS — Z8719 Personal history of other diseases of the digestive system: Secondary | ICD-10-CM | POA: Insufficient documentation

## 2016-02-14 DIAGNOSIS — Z79899 Other long term (current) drug therapy: Secondary | ICD-10-CM | POA: Insufficient documentation

## 2016-02-14 DIAGNOSIS — R531 Weakness: Secondary | ICD-10-CM | POA: Insufficient documentation

## 2016-02-14 DIAGNOSIS — E119 Type 2 diabetes mellitus without complications: Secondary | ICD-10-CM | POA: Insufficient documentation

## 2016-02-14 DIAGNOSIS — Z9981 Dependence on supplemental oxygen: Secondary | ICD-10-CM | POA: Insufficient documentation

## 2016-02-14 DIAGNOSIS — R200 Anesthesia of skin: Secondary | ICD-10-CM | POA: Diagnosis not present

## 2016-02-14 DIAGNOSIS — R55 Syncope and collapse: Secondary | ICD-10-CM

## 2016-02-14 DIAGNOSIS — R1310 Dysphagia, unspecified: Secondary | ICD-10-CM

## 2016-02-14 DIAGNOSIS — I480 Paroxysmal atrial fibrillation: Secondary | ICD-10-CM | POA: Diagnosis not present

## 2016-02-14 DIAGNOSIS — F0390 Unspecified dementia without behavioral disturbance: Secondary | ICD-10-CM | POA: Insufficient documentation

## 2016-02-14 DIAGNOSIS — Z7901 Long term (current) use of anticoagulants: Secondary | ICD-10-CM | POA: Insufficient documentation

## 2016-02-14 DIAGNOSIS — G4733 Obstructive sleep apnea (adult) (pediatric): Secondary | ICD-10-CM | POA: Diagnosis not present

## 2016-02-14 LAB — I-STAT CHEM 8, ED
BUN: 13 mg/dL (ref 6–20)
CALCIUM ION: 1.09 mmol/L — AB (ref 1.13–1.30)
CHLORIDE: 100 mmol/L — AB (ref 101–111)
Creatinine, Ser: 0.8 mg/dL (ref 0.61–1.24)
GLUCOSE: 148 mg/dL — AB (ref 65–99)
HCT: 44 % (ref 39.0–52.0)
Hemoglobin: 15 g/dL (ref 13.0–17.0)
Potassium: 3.8 mmol/L (ref 3.5–5.1)
Sodium: 140 mmol/L (ref 135–145)
TCO2: 26 mmol/L (ref 0–100)

## 2016-02-14 LAB — PROTIME-INR
INR: 1.24 (ref 0.00–1.49)
Prothrombin Time: 15.8 seconds — ABNORMAL HIGH (ref 11.6–15.2)

## 2016-02-14 LAB — DIFFERENTIAL
Basophils Absolute: 0.1 10*3/uL (ref 0.0–0.1)
Basophils Relative: 1 %
Eosinophils Absolute: 0.2 10*3/uL (ref 0.0–0.7)
Eosinophils Relative: 3 %
LYMPHS PCT: 20 %
Lymphs Abs: 1.5 10*3/uL (ref 0.7–4.0)
MONO ABS: 1.1 10*3/uL — AB (ref 0.1–1.0)
MONOS PCT: 15 %
NEUTROS ABS: 4.7 10*3/uL (ref 1.7–7.7)
Neutrophils Relative %: 61 %

## 2016-02-14 LAB — COMPREHENSIVE METABOLIC PANEL
ALT: 25 U/L (ref 17–63)
AST: 22 U/L (ref 15–41)
Albumin: 3.7 g/dL (ref 3.5–5.0)
Alkaline Phosphatase: 76 U/L (ref 38–126)
Anion gap: 11 (ref 5–15)
BILIRUBIN TOTAL: 0.6 mg/dL (ref 0.3–1.2)
BUN: 13 mg/dL (ref 6–20)
CO2: 26 mmol/L (ref 22–32)
Calcium: 9.1 mg/dL (ref 8.9–10.3)
Chloride: 102 mmol/L (ref 101–111)
Creatinine, Ser: 0.78 mg/dL (ref 0.61–1.24)
Glucose, Bld: 155 mg/dL — ABNORMAL HIGH (ref 65–99)
POTASSIUM: 4 mmol/L (ref 3.5–5.1)
Sodium: 139 mmol/L (ref 135–145)
TOTAL PROTEIN: 6.3 g/dL — AB (ref 6.5–8.1)

## 2016-02-14 LAB — CBC
HEMATOCRIT: 40.7 % (ref 39.0–52.0)
Hemoglobin: 13.4 g/dL (ref 13.0–17.0)
MCH: 29.9 pg (ref 26.0–34.0)
MCHC: 32.9 g/dL (ref 30.0–36.0)
MCV: 90.8 fL (ref 78.0–100.0)
Platelets: 245 10*3/uL (ref 150–400)
RBC: 4.48 MIL/uL (ref 4.22–5.81)
RDW: 12.9 % (ref 11.5–15.5)
WBC: 7.5 10*3/uL (ref 4.0–10.5)

## 2016-02-14 LAB — RAPID URINE DRUG SCREEN, HOSP PERFORMED
Amphetamines: NOT DETECTED
BARBITURATES: NOT DETECTED
BENZODIAZEPINES: NOT DETECTED
COCAINE: NOT DETECTED
Opiates: NOT DETECTED
Tetrahydrocannabinol: NOT DETECTED

## 2016-02-14 LAB — URINALYSIS, ROUTINE W REFLEX MICROSCOPIC
Bilirubin Urine: NEGATIVE
GLUCOSE, UA: NEGATIVE mg/dL
Hgb urine dipstick: NEGATIVE
Ketones, ur: NEGATIVE mg/dL
Nitrite: NEGATIVE
PROTEIN: NEGATIVE mg/dL
SPECIFIC GRAVITY, URINE: 1.016 (ref 1.005–1.030)
pH: 6 (ref 5.0–8.0)

## 2016-02-14 LAB — APTT: aPTT: 32 seconds (ref 24–37)

## 2016-02-14 LAB — URINE MICROSCOPIC-ADD ON: RBC / HPF: NONE SEEN RBC/hpf (ref 0–5)

## 2016-02-14 LAB — I-STAT TROPONIN, ED: TROPONIN I, POC: 0 ng/mL (ref 0.00–0.08)

## 2016-02-14 LAB — POC OCCULT BLOOD, ED: FECAL OCCULT BLD: NEGATIVE

## 2016-02-14 LAB — ETHANOL

## 2016-02-14 NOTE — Discharge Instructions (Signed)
Dysphagia Swallowing problems (dysphagia) occur when solids and liquids seem to stick in your throat on the way down to your stomach, or the food takes longer to get to the stomach. Other symptoms include regurgitating food, noises coming from the throat, chest discomfort with swallowing, and a feeling of fullness or the feeling of something being stuck in your throat when swallowing. When blockage in your throat is complete, it may be associated with drooling. CAUSES  Problems with swallowing may occur because of problems with the muscles. The food cannot be propelled in the usual manner into your stomach. You may have ulcers, scar tissue, or inflammation in the tube down which food travels from your mouth to your stomach (esophagus), which blocks food from passing normally into the stomach. Causes of inflammation include:  Acid reflux from your stomach into your esophagus.  Infection.  Radiation treatment for cancer.  Medicines taken without enough fluids to wash them down into your stomach. You may have nerve problems that prevent signals from being sent to the muscles of your esophagus to contract and move your food down to your stomach. Globus pharyngeus is a relatively common problem in which there is a sense of an obstruction or difficulty in swallowing, without any physical abnormalities of the swallowing passages being found. This problem usually improves over time with reassurance and testing to rule out other causes. DIAGNOSIS Dysphagia can be diagnosed and its cause can be determined by tests in which you swallow a white substance that helps illuminate the inside of your throat (contrast medium) while X-rays are taken. Sometimes a flexible telescope that is inserted down your throat (endoscopy) to look at your esophagus and stomach is used. TREATMENT   If the dysphagia is caused by acid reflux or infection, medicines may be used.  If the dysphagia is caused by problems with your  swallowing muscles, swallowing therapy may be used to help you strengthen your swallowing muscles.  If the dysphagia is caused by a blockage or mass, procedures to remove the blockage may be done. HOME CARE INSTRUCTIONS  Try to eat soft food that is easier to swallow and check your weight on a daily basis to be sure that it is not decreasing.  Be sure to drink liquids when sitting upright (not lying down). SEEK MEDICAL CARE IF:  You are losing weight because you are unable to swallow.  You are coughing when you drink liquids (aspiration).  You are coughing up partially digested food. SEEK IMMEDIATE MEDICAL CARE IF:  You are unable to swallow your own saliva .  You are having shortness of breath or a fever, or both.  You have a hoarse voice along with difficulty swallowing. MAKE SURE YOU:  Understand these instructions.  Will watch your condition.  Will get help right away if you are not doing well or get worse.   This information is not intended to replace advice given to you by your health care provider. Make sure you discuss any questions you have with your health care provider.   Document Released: 10/25/2000 Document Revised: 11/18/2014 Document Reviewed: 04/16/2013 Elsevier Interactive Patient Education 2016 [employer].  Syncope Syncope is a medical term for fainting or passing out. This means you lose consciousness and drop to the ground. People are generally unconscious for less than 5 minutes. You may have some muscle twitches for up to 15 seconds before waking up and returning to normal. Syncope occurs more often in older adults, but it can happen to  anyone. While most causes of syncope are not dangerous, syncope can be a sign of a serious medical problem. It is important to seek medical care.  CAUSES  Syncope is caused by a sudden drop in blood flow to the brain. The specific cause is often not determined. Factors that can bring on syncope include:  Taking  medicines that lower blood pressure.  Sudden changes in posture, such as standing up quickly.  Taking more medicine than prescribed.  Standing in one place for too long.  Seizure disorders.  Dehydration and excessive exposure to heat.  Low blood sugar (hypoglycemia).  Straining to have a bowel movement.  Heart disease, irregular heartbeat, or other circulatory problems.  Fear, emotional distress, seeing blood, or severe pain. SYMPTOMS  Right before fainting, you may:  Feel dizzy or light-headed.  Feel nauseous.  See all white or all black in your field of vision.  Have cold, clammy skin. DIAGNOSIS  Your health care provider will ask about your symptoms, perform a physical exam, and perform an electrocardiogram (ECG) to record the electrical activity of your heart. Your health care provider may also perform other heart or blood tests to determine the cause of your syncope which may include:  Transthoracic echocardiogram (TTE). During echocardiography, sound waves are used to evaluate how blood flows through your heart.  Transesophageal echocardiogram (TEE).  Cardiac monitoring. This allows your health care provider to monitor your heart rate and rhythm in real time.  Holter monitor. This is a portable device that records your heartbeat and can help diagnose heart arrhythmias. It allows your health care provider to track your heart activity for several days, if needed.  Stress tests by exercise or by giving medicine that makes the heart beat faster. TREATMENT  In most cases, no treatment is needed. Depending on the cause of your syncope, your health care provider may recommend changing or stopping some of your medicines. HOME CARE INSTRUCTIONS  Have someone stay with you until you feel stable.  Do not drive, use machinery, or play sports until your health care provider says it is okay.  Keep all follow-up appointments as directed by your health care provider.  Lie  down right away if you start feeling like you might faint. Breathe deeply and steadily. Wait until all the symptoms have passed.  Drink enough fluids to keep your urine clear or pale yellow.  If you are taking blood pressure or heart medicine, get up slowly and take several minutes to sit and then stand. This can reduce dizziness. SEEK IMMEDIATE MEDICAL CARE IF:   You have a severe headache.  You have unusual pain in the chest, abdomen, or back.  You are bleeding from your mouth or rectum, or you have black or tarry stool.  You have an irregular or very fast heartbeat.  You have pain with breathing.  You have repeated fainting or seizure-like jerking during an episode.  You faint when sitting or lying down.  You have confusion.  You have trouble walking.  You have severe weakness.  You have vision problems. If you fainted, call your local emergency services (911 in [place] Do not drive yourself to the hospital.    This information is not intended to replace advice given to you by your health care provider. Make sure you discuss any questions you have with your health care provider.   Document Released: 10/28/2005 Document Revised: 03/14/2015 Document Reviewed: 12/27/2011 Elsevier Interactive Patient Education [employer]2016 Elsevier Inc.

## 2016-02-14 NOTE — ED Notes (Signed)
Dr. Pollina at bedside   

## 2016-02-14 NOTE — ED Notes (Signed)
Pt to ED from home by [facility] c/o code stroke. LSN 2200. Wife found pt lying on the floor next to bed with coffee ground emesis. Pt reports feeling unable to swallow, drooling noted to R side of face. EMs reports pt recently was started on keppra, Pt is also legally blind

## 2016-02-14 NOTE — Consult Note (Signed)
NEURO HOSPITALIST CONSULT NOTE      Reason for Consult:Inability to swallow   History obtained from:  Patient    HPI:                                                                                                                                          Richard Lawson is an 82 y.o. male with a PMHx of dementia, PAF on AC, and DM brought in by ambulance, who presents to the ED after wife found him on the ground with coffee ground emesis and inability to swallow, a  code stroke was called. Per wife, pt was last seen normal at 10 PM. As per patient the swallowing issue has been ongoing for the past month, otherwise a non focal exam. Patient was also recently started on Keppra for a syncopal episode that they were ruling out seizure.   Past Medical History  Diagnosis Date  . Retinitis pigmentosa of both eyes   . GERD (gastroesophageal reflux disease)   . Paroxysmal a-fib (HCC)   . Type II diabetes mellitus (HCC)   . Dementia     "not from CVA; unclear if it's Alzheimer's dementia at time" (01/12/2016)  . Lung infection 01/2015  . OSA on CPAP   . On home oxygen therapy     w/CPAP  . History of hiatal hernia   . Unresponsiveness 01/12/2016    "at home this am til he got here to ER; probably lasted ~ 2 hours"     Past Surgical History  Procedure Laterality Date  . Orif radial head / neck fracture Right 10/2009  . Fracture surgery    . Cataract extraction      "don't remember which side"  . Transurethral resection of prostate  02/2010    Family History  Problem Relation Age of Onset  . Lung cancer Father   . COPD Brother       Social History:  reports that he has never smoked. He has never used smokeless tobacco. He reports that he does not drink alcohol or use illicit drugs.  Allergies  Allergen Reactions  . Metformin     Altered Mental Status    MEDICATIONS:  I have reviewed the patient's current medications.   ROS:                                                                                                                                       History obtained from the patient  General ROS: negative for - chills, fatigue, fever, night sweats, weight gain or weight loss Psychological ROS: negative for - behavioral disorder, hallucinations, memory difficulties, mood swings or suicidal ideation Ophthalmic ROS: negative for - blurry vision, double vision, eye pain or loss of vision ENT ROS: negative for - epistaxis, nasal discharge, oral lesions, sore throat, tinnitus or vertigo Allergy and Immunology ROS: negative for - hives or itchy/watery eyes Hematological and Lymphatic ROS: negative for - bleeding problems, bruising or swollen lymph nodes Endocrine ROS: negative for - galactorrhea, hair pattern changes, polydipsia/polyuria or temperature intolerance Respiratory ROS: negative for - cough, hemoptysis, shortness of breath or wheezing Cardiovascular ROS: negative for - chest pain, dyspnea on exertion, edema or irregular heartbeat Gastrointestinal ROS: negative for - abdominal pain, diarrhea, hematemesis, nausea/vomiting or stool incontinence Genito-Urinary ROS: negative for - dysuria, hematuria, incontinence or urinary frequency/urgency Musculoskeletal ROS: negative for - joint swelling or muscular weakness Neurological ROS: as noted in HPI Dermatological ROS: negative for rash and skin lesion changes   Temperature 98 F (36.7 C).   Neurologic Examination:                                                                                                      HEENT-  Normocephalic, no lesions, without obvious abnormality.  Normal external eye and conjunctiva.  Normal TM's bilaterally.  Normal auditory canals and external ears. Normal external nose, mucus membranes and septum.  Normal pharynx. Cardiovascular- irregularly  irregular rhythm, pulses palpable throughout   Lungs- chest clear, no wheezing, rales, normal symmetric air entry, Heart exam - S1, S2 normal, no murmur, no gallop, rate regular Abdomen- soft, non-tender; bowel sounds normal; no masses,  no organomegaly   Neurological Examination Mental Status: Alert, oriented, thought content appropriate.  Speech fluent without evidence of aphasia.  Able to follow 2step commands without difficulty. Cranial Nerves: II: Discs flat bilaterally; blind in both eyes III,IV, VI: ptosis not present, extra-ocular motions intact bilaterally V,VII: smile symmetric, facial light touch sensation normal bilaterally VIII: hearing normal bilaterally IX,X: uvula rises symmetrically XI: bilateral shoulder shrug XII: midline tongue extension Motor: Right : Upper extremity   5/5    Left:     Upper  extremity   5/5  Lower extremity   5/5     Lower extremity   5/5 Tone and bulk:normal tone throughout; no atrophy noted Sensory: Pinprick and light touch intact throughout, bilaterally  Plantars: Right: downgoing   Left: downgoing        Lab Results: Basic Metabolic Panel:  Recent Labs Lab 02/14/16 0018  NA 140  K 3.8  CL 100*  GLUCOSE 148*  BUN 13  CREATININE 0.80    Liver Function Tests: No results for input(s): AST, ALT, ALKPHOS, BILITOT, PROT, ALBUMIN in the last 168 hours. No results for input(s): LIPASE, AMYLASE in the last 168 hours. No results for input(s): AMMONIA in the last 168 hours.  CBC:  Recent Labs Lab 02/14/16 0018 02/14/16 0025  WBC  --  7.5  NEUTROABS  --  4.7  HGB 15.0 13.4  HCT 44.0 40.7  MCV  --  90.8  PLT  --  245    Cardiac Enzymes: No results for input(s): CKTOTAL, CKMB, CKMBINDEX, TROPONINI in the last 168 hours.  Lipid Panel: No results for input(s): CHOL, TRIG, HDL, CHOLHDL, VLDL, LDLCALC in the last 168 hours.  CBG: No results for input(s): GLUCAP in the last 168 hours.  Microbiology: Results for orders  placed or performed during the hospital encounter of 01/24/16  Urine culture     Status: None   Collection Time: 01/24/16  5:37 PM  Result Value Ref Range Status   Specimen Description URINE, CLEAN CATCH  Final   Special Requests NONE  Final   Culture MULTIPLE SPECIES PRESENT, SUGGEST RECOLLECTION  Final   Report Status 01/26/2016 FINAL  Final    Coagulation Studies:  Recent Labs  02/14/16 0025  LABPROT 15.8*  INR 1.24    Imaging: Ct Head Wo Contrast  02/14/2016  CLINICAL DATA:  Found on floor.  Difficulty swallowing. EXAM: CT HEAD WITHOUT CONTRAST TECHNIQUE: Contiguous axial images were obtained from the base of the skull through the vertex without intravenous contrast. COMPARISON:  01/24/2016 FINDINGS: There is no intracranial hemorrhage, mass or evidence of acute infarction. There is no extra-axial fluid collection. There is moderate generalized atrophy. No acute intracranial finding is evident. No interval changes at from 01/24/2016. IMPRESSION: No acute intracranial findings. Moderate generalized atrophy. Unchanged from 01/24/2016. Critical Value/emergent results were called by telephone at the time of interpretation on 02/14/2016 at 12:28 am to Dr. Sherri Rad, who verbally acknowledged these results. Electronically Signed   By: Ellery Plunk M.D.   On: 02/14/2016 00:31       Assessment and plan per attending neurologist  Felicie Morn PA-C [facility] [phone]  02/14/2016, 12:52 AM   Assessment/Plan:   82 y.o. male with a PMHx of dementia, PAF on AC, and DM brought in by ambulance, who presents to the ED after wife found him on the ground with coffee ground emesis and inability to swallow, a  code stroke was called. Per wife, pt was last seen normal at 10 PM. As per patient the swallowing issue has been ongoing for the past month, otherwise a non focal exam. Patient was also recently started on Keppra for a syncopal episode that they were ruling out  seizure.    - CTH negative for any acute abnormality - GI consultation for coffee ground emesis, on AC for afib.  -  Needs mechanical workup inability to swallow, may need ENT as well, if all negative then may require MRI brain to rule out brainstem infract but this is  very unlikely considering transient symptoms and for that prolong period time without any other neurological deficits. - Last EEG was normal, may considering calling neurology and discussing stopping Keppra - No further workup from stroke perspective is necessary

## 2016-02-14 NOTE — ED Provider Notes (Signed)
CSN: [id number]     Arrival date & time 02/14/16  0006 History  By signing my name below, I, Richard Lawson, attest that this documentation has been prepared under the direction and in the presence of Richard Crease, MD. Electronically Signed: Budd Lawson, ED Scribe. 02/14/2016. 12:20 AM.    Chief Complaint  Patient presents with  . Code Stroke   The history is provided by the patient, the EMS personnel and the spouse. No language interpreter was used.   HPI Comments:  Richard Lawson is a 82 y.o. male with a PMHx of dementia, paroxysmal A-fib, and DM brought in by ambulance, who presents to the Emergency Department complaining of a possible code stroke that occurred within the past 2 hours. Per wife, pt was last seen normal at 10 PM. Per EMS, pt's wife found him on the floor with coffee ground sputum next to him, and pt c/o inability to swallow. They notes that upon their arrival, pt at first was unresponsive on extremities and had spit bubbles at the mouth, but now has a normal neurological exam. Per wife pt was scheduled for a swallowing test in the past, but was never completed. She does not recall the reason for scheduling this test in the first place. Per wife, pt has a PMHx of A-fib and undiagnosed seizures.   Past Medical History  Diagnosis Date  . Retinitis pigmentosa of both eyes   . GERD (gastroesophageal reflux disease)   . Paroxysmal a-fib (HCC)   . Type II diabetes mellitus (HCC)   . Dementia     "not from CVA; unclear if it's Alzheimer's dementia at time" (01/12/2016)  . Lung infection 01/2015  . OSA on CPAP   . On home oxygen therapy     w/CPAP  . History of hiatal hernia   . Unresponsiveness 01/12/2016    "at home this am til he got here to ER; probably lasted ~ 2 hours"    Past Surgical History  Procedure Laterality Date  . Orif radial head / neck fracture Right 10/2009  . Fracture surgery    . Cataract extraction      "don't remember which side"  .  Transurethral resection of prostate  02/2010   Family History  Problem Relation Age of Onset  . Lung cancer Father   . COPD Brother    Social History  Substance Use Topics  . Smoking status: Never Smoker   . Smokeless tobacco: Never Used  . Alcohol Use: No    Review of Systems  HENT: Positive for trouble swallowing.   Neurological: Positive for weakness and numbness.  All other systems reviewed and are negative.   Allergies  Metformin  Home Medications   Prior to Admission medications   Medication Sig Start Date End Date Taking? Authorizing Provider  edoxaban (SAVAYSA) 60 MG TABS tablet Take 60 mg by mouth daily.   Yes Historical Provider, MD  metoprolol tartrate (LOPRESSOR) 25 MG tablet Take 0.5 tablets (12.5 mg total) by mouth 2 (two) times daily. Patient taking differently: Take 25 mg by mouth 2 (two) times daily.  01/16/16  Yes [name], MD  vitamin B-12 (CYANOCOBALAMIN) 100 MCG tablet Take 100 mcg by mouth daily.   Yes Historical Provider, MD  levETIRAcetam (KEPPRA) 250 MG tablet Take 2 tablets (500 mg total) by mouth 2 (two) times daily. Patient not taking: Reported on 02/14/2016 01/25/16   Richard Monday, MD   BP 133/76 mmHg  Pulse 76  Temp(Src) 98  F (36.7 C)  Resp 12  SpO2 98% Physical Exam  Constitutional: He is oriented to person, place, and time. He appears well-developed and well-nourished. No distress.  HENT:  Head: Normocephalic and atraumatic.  Right Ear: Hearing normal.  Left Ear: Hearing normal.  Nose: Nose normal.  Mouth/Throat: Mucous membranes are normal.  Secretions in mouth  Eyes: Conjunctivae and EOM are normal. Pupils are equal, round, and reactive to light.  Neck: Normal range of motion. Neck supple.  Cardiovascular: Regular rhythm, S1 normal and S2 normal.  Exam reveals no gallop and no friction rub.   No murmur heard. Pulmonary/Chest: Effort normal and breath sounds normal. No respiratory distress. He exhibits no tenderness.  Abdominal:  Soft. Normal appearance and bowel sounds are normal. There is no hepatosplenomegaly. There is no tenderness. There is no rebound, no guarding, no tenderness at McBurney's point and negative Murphy's sign. No hernia.  Musculoskeletal: Normal range of motion.  Neurological: He is alert and oriented to person, place, and time. He has normal strength. No cranial nerve deficit or sensory deficit. Coordination normal. GCS eye subscore is 4. GCS verbal subscore is 5. GCS motor subscore is 6.  Skin: Skin is warm, dry and intact. No rash noted. No cyanosis.  Psychiatric: He has a normal mood and affect. His speech is normal and behavior is normal. Thought content normal.  Nursing note and vitals reviewed.   ED Course  Procedures   COORDINATION OF CARE: 12:12 AM - Discussed plans to order diagnostic studies and imaging. Pt advised of plan for treatment and pt agrees.  Labs Review Labs Reviewed  PROTIME-INR - Abnormal; Notable for the following:    Prothrombin Time 15.8 (*)    All other components within normal limits  DIFFERENTIAL - Abnormal; Notable for the following:    Monocytes Absolute 1.1 (*)    All other components within normal limits  COMPREHENSIVE METABOLIC PANEL - Abnormal; Notable for the following:    Glucose, Bld 155 (*)    Total Protein 6.3 (*)    All other components within normal limits  I-STAT CHEM 8, ED - Abnormal; Notable for the following:    Chloride 100 (*)    Glucose, Bld 148 (*)    Calcium, Ion 1.09 (*)    All other components within normal limits  ETHANOL  APTT  CBC  URINE RAPID DRUG SCREEN, HOSP PERFORMED  URINALYSIS, ROUTINE W REFLEX MICROSCOPIC (NOT AT [facility])  OCCULT BLOOD GASTRIC / DUODENUM (SPECIMEN CUP)  Rosezena Sensor, ED  POC OCCULT BLOOD, ED    Imaging Review Ct Head Wo Contrast  02/14/2016  CLINICAL DATA:  Found on floor.  Difficulty swallowing. EXAM: CT HEAD WITHOUT CONTRAST TECHNIQUE: Contiguous axial images were obtained from the base of the  skull through the vertex without intravenous contrast. COMPARISON:  01/24/2016 FINDINGS: There is no intracranial hemorrhage, mass or evidence of acute infarction. There is no extra-axial fluid collection. There is moderate generalized atrophy. No acute intracranial finding is evident. No interval changes at from 01/24/2016. IMPRESSION: No acute intracranial findings. Moderate generalized atrophy. Unchanged from 01/24/2016. Critical Value/emergent results were called by telephone at the time of interpretation on 02/14/2016 at 12:28 am to Dr. Sherri Rad, who verbally acknowledged these results. Electronically Signed   By: Ellery Plunk M.D.   On: 02/14/2016 00:31   I have personally reviewed and evaluated these images and lab results as part of my medical decision-making.   EKG Interpretation   Date/Time:  Wednesday February 14 2016 00:32:15 EDT Ventricular Rate:  86 PR Interval:  140 QRS Duration: 112 QT Interval:  385 QTC Calculation: 460 R Axis:   -53 Text Interpretation:  Sinus rhythm Left anterior fascicular block Low  voltage, precordial leads Abnormal R-wave progression, early transition  Consider anterior infarct No significant change since last tracing  Confirmed by POLLINA  MD, CHRISTOPHER ([id number]) on 02/14/2016 1:31:01 AM      MDM   Final diagnoses:  None  Syncope Dysphagia  Patient presents to the ER by ambulance from home. Wife reports that he was sitting in a chair for approximately 10 minutes before she left the room. When she came back into the room after a brief time, he was on the floor. He was complaining of having difficulty swallowing. EMS report that he has been spitting up his saliva during transport, but has not had any other focal deficit. He was brought to the ER as a code stroke. He was evaluated by neurology. There was no evidence of stroke present, code stroke was discontinued.  There was concern that the saliva and liquid that he was spitting up and drooling  initially he might have been coffee-ground in nature. I have not seen any bleeding here in the ER. His rectal exam was heme negative. He is hemodynamically stable with a stable hemoglobin. No concern for ongoing bleeding.  Patient is back to his baseline. He is handling his secretions without difficulty currently. It sounds like he has been experiencing ongoing issues with swallowing for some time. He will require follow-up swallow study, but nothing emergent.   I did discuss the possibility of seizure with Dr. Cherylynn RidgesShikhman. He did not feel that the swallowing difficulty was a sequela of seizure tonight because he has been having ongoing issues with this. He did not recommend any changes in his seizure medication.  As outpatient follow-up scheduled in one week with his neurologist. I do not feel any medication changes are necessary. As he has had multiple episodes similar to this in the past with hospitalization and negative workup, he does not require repeat hospitalization.  I personally performed the services described in this documentation, which was scribed in my presence. The recorded information has been reviewed and is accurate.    Richard Creasehristopher J Pollina, MD 02/14/16 [phone]

## 2016-02-14 NOTE — ED Notes (Signed)
Pts wife to call for ride.

## 2016-02-22 ENCOUNTER — Ambulatory Visit: Payer: Medicare Other | Admitting: Neurology

## 2016-03-11 ENCOUNTER — Encounter (HOSPITAL_COMMUNITY): Payer: Self-pay | Admitting: Nurse Practitioner

## 2016-03-11 ENCOUNTER — Emergency Department (HOSPITAL_COMMUNITY): Payer: Medicare Other

## 2016-03-11 ENCOUNTER — Emergency Department (HOSPITAL_COMMUNITY)
Admission: EM | Admit: 2016-03-11 | Discharge: 2016-03-11 | Disposition: A | Discharge status: Home / Self Care | Payer: Medicare Other | Attending: Emergency Medicine | Admitting: Emergency Medicine

## 2016-03-11 DIAGNOSIS — E119 Type 2 diabetes mellitus without complications: Secondary | ICD-10-CM | POA: Diagnosis not present

## 2016-03-11 DIAGNOSIS — R4182 Altered mental status, unspecified: Secondary | ICD-10-CM | POA: Diagnosis present

## 2016-03-11 DIAGNOSIS — F0390 Unspecified dementia without behavioral disturbance: Secondary | ICD-10-CM | POA: Diagnosis not present

## 2016-03-11 DIAGNOSIS — G4733 Obstructive sleep apnea (adult) (pediatric): Secondary | ICD-10-CM | POA: Insufficient documentation

## 2016-03-11 DIAGNOSIS — R531 Weakness: Secondary | ICD-10-CM | POA: Diagnosis not present

## 2016-03-11 DIAGNOSIS — R2681 Unsteadiness on feet: Secondary | ICD-10-CM

## 2016-03-11 DIAGNOSIS — Z9981 Dependence on supplemental oxygen: Secondary | ICD-10-CM | POA: Insufficient documentation

## 2016-03-11 DIAGNOSIS — Z79899 Other long term (current) drug therapy: Secondary | ICD-10-CM | POA: Insufficient documentation

## 2016-03-11 LAB — CBC
HCT: 41.8 % (ref 39.0–52.0)
HEMOGLOBIN: 13.8 g/dL (ref 13.0–17.0)
MCH: 29.5 pg (ref 26.0–34.0)
MCHC: 33 g/dL (ref 30.0–36.0)
MCV: 89.3 fL (ref 78.0–100.0)
PLATELETS: 253 10*3/uL (ref 150–400)
RBC: 4.68 MIL/uL (ref 4.22–5.81)
RDW: 13 % (ref 11.5–15.5)
WBC: 7.1 10*3/uL (ref 4.0–10.5)

## 2016-03-11 LAB — URINALYSIS, ROUTINE W REFLEX MICROSCOPIC
Bilirubin Urine: NEGATIVE
Glucose, UA: NEGATIVE mg/dL
Ketones, ur: 15 mg/dL — AB
NITRITE: NEGATIVE
PROTEIN: NEGATIVE mg/dL
SPECIFIC GRAVITY, URINE: 1.015 (ref 1.005–1.030)
pH: 6 (ref 5.0–8.0)

## 2016-03-11 LAB — URINE MICROSCOPIC-ADD ON: BACTERIA UA: NONE SEEN

## 2016-03-11 LAB — COMPREHENSIVE METABOLIC PANEL
ALBUMIN: 3.7 g/dL (ref 3.5–5.0)
ALT: 30 U/L (ref 17–63)
ANION GAP: 10 (ref 5–15)
AST: 25 U/L (ref 15–41)
Alkaline Phosphatase: 82 U/L (ref 38–126)
BILIRUBIN TOTAL: 0.7 mg/dL (ref 0.3–1.2)
BUN: 7 mg/dL (ref 6–20)
CHLORIDE: 106 mmol/L (ref 101–111)
CO2: 25 mmol/L (ref 22–32)
Calcium: 9 mg/dL (ref 8.9–10.3)
Creatinine, Ser: 0.71 mg/dL (ref 0.61–1.24)
GFR calc Af Amer: >60 mL/min (ref >60)
GFR calc non Af Amer: >60 mL/min (ref >60)
GLUCOSE: 132 mg/dL — AB (ref 65–99)
POTASSIUM: 3.8 mmol/L (ref 3.5–5.1)
SODIUM: 141 mmol/L (ref 135–145)
TOTAL PROTEIN: 6.7 g/dL (ref 6.5–8.1)

## 2016-03-11 NOTE — ED Notes (Signed)
Wife reports she went in their bedroom this morning and found the patient confused and holding onto the closet doors, he told her he could not let go or he may fall. He has a history of dementia but is acutely confused today per the wife. He did tell her that he had a headache around 6 am this morning and wanted to keep sleeping, so she left the room and returned about 1000 to find him awake and more confused than normal. Wife reports the pt has been complaining of a headache this past week. She took the patient to his primary care doctor who wanted him to come to ED for further evaluation. The pt is alert, confused, generally weak.

## 2016-03-11 NOTE — Discharge Instructions (Signed)

## 2016-03-11 NOTE — ED Notes (Signed)
Discussed setting up home health w/ case management. Case management also spoke with pt's wife to discuss details of home health. Case management to contact Dr. Effie ShyWentz re: orders for home health.

## 2016-03-11 NOTE — Care Management Note (Addendum)
Case Management Note  Patient Details  Name: Richard Lawson MRN: [id number] Date of Birth: June 02, 1932  Subjective/Objective:  Patient presents to ED with increased confusion and weakness.  Patient with pmhx of dementia, blind.                  Action/Plan: EDCM spoke to patient's wife who reports [facility].  Patient lives at home with her.  Patient's wife expressed interest in light weight wheelchair for patient and [facility].  [facility]  She reports she will [facility] the wheelchair [facility].  Pcp is Dr. Tenny Crawoss.  Patient has a walker at home.  Wife reports she will buy shower chair from a store.  EDCM explained [facility] to patient's wife.  [facility].  Discussed patient with EDRN and EDP.  EDP will place hoem health orders for RN, PT, OT aide and social work.  No further EDCM needs at this time.   Expected Discharge Date:                  Expected Discharge Plan:  Home w Home Health Services  In-House Referral:     Discharge planning Services  CM Consult  Post Acute Care Choice:  Durable Medical Equipment, Home Health Choice offered to:  Spouse  DME Arranged:  Wheelchair manual DME Agency:  [facility]  HH Arranged:  RN, PT, OT, Nurse's Aide, Social Work [facility] Agency:  [facility]  Status of Service:  Completed, signed off  Medicare Important Message Given:    Date Medicare IM Given:    Medicare IM give by:    Date Additional Medicare IM Given:    Additional Medicare Important Message give by:     If discussed at Long Length of Stay Meetings, dates discussed:    Additional Comments:  EDCM [facility] and [facility] with confirmation of receipt.  Bennie DallasFERRERO, Ketrick Matney, RN 03/11/2016, 5:27 PM

## 2016-03-11 NOTE — Progress Notes (Signed)
Patient suffers from dementia, weakness, blind which impairs their ability to perform daily activities like ADL's in the home. A walker, cane will not resolve  issue with performing activities of daily living. A wheelchair will allow patient to safely perform daily activities. Patient is not able to propel themselves in the home using a standard weight wheelchair due to weakness. Patient can self propel in the lightweight wheelchair.  Accessories: elevating leg rests (ELRs), wheel locks, extensions and anti-tippers.

## 2016-03-11 NOTE — ED Notes (Signed)
Pt wife verbalized understanding of d/c instructions, prescriptions, and follow-up care. No further questions/concerns, VSS, ambulatory w/ steady gait (refused wheelchair)

## 2016-03-11 NOTE — ED Notes (Signed)
Pt ambulatory in hallway with semi-steady gait. Per wife, pt is walking per norm.

## 2016-03-11 NOTE — ED Provider Notes (Signed)
CSN: [id number]     Arrival date & time 03/11/16  1211 History   First MD Initiated Contact with Patient 03/11/16 1242     Chief Complaint  Patient presents with  . Altered Mental Status    Low 5 caveat due to dementia. Patient is a 82 y.o. male presenting with altered mental status. The history is provided by the patient and the spouse.  Altered Mental Status  patient presents with altered mental status. Reportedly was a little more confused today and had difficulty standing. Reportedly was having some difficulty standing and had a hold on to the door. Had been normal more this morning. Has had a cough for the last couple weeks. Denies dysuria. Does have a history of dementia but is reportedly more confused. Has had recent admissions to hospital for syncope and strokelike symptoms.  Past Medical History  Diagnosis Date  . Retinitis pigmentosa of both eyes   . GERD (gastroesophageal reflux disease)   . Paroxysmal a-fib (HCC)   . Type II diabetes mellitus (HCC)   . Dementia     "not from CVA; unclear if it's Alzheimer's dementia at time" (01/12/2016)  . Lung infection 01/2015  . OSA on CPAP   . On home oxygen therapy     w/CPAP  . History of hiatal hernia   . Unresponsiveness 01/12/2016    "at home this am til he got here to ER; probably lasted ~ 2 hours"    Past Surgical History  Procedure Laterality Date  . Orif radial head / neck fracture Right 10/2009  . Fracture surgery    . Cataract extraction      "don't remember which side"  . Transurethral resection of prostate  02/2010   Family History  Problem Relation Age of Onset  . Lung cancer Father   . COPD Brother    Social History  Substance Use Topics  . Smoking status: Never Smoker   . Smokeless tobacco: Never Used  . Alcohol Use: No    Review of Systems  Unable to perform ROS: Dementia      Allergies  Metformin and Aricept  Home Medications   Prior to Admission medications   Medication Sig Start Date End Date  Taking? Authorizing Provider  Cyanocobalamin (B-12) 2500 MCG TABS Take 2,500 mcg by mouth daily.   Yes Historical Provider, MD  edoxaban (SAVAYSA) 60 MG TABS tablet Take 60 mg by mouth daily.   Yes Historical Provider, MD  metoprolol tartrate (LOPRESSOR) 25 MG tablet Take 0.5 tablets (12.5 mg total) by mouth 2 (two) times daily. Patient taking differently: Take 25 mg by mouth 2 (two) times daily.  01/16/16  Yes [name], MD  traZODone (DESYREL) 50 MG tablet Take 25 mg by mouth at bedtime as needed. 02/27/16  Yes Historical Provider, MD  levETIRAcetam (KEPPRA) 250 MG tablet Take 2 tablets (500 mg total) by mouth 2 (two) times daily. 01/25/16   [name], MD   BP 136/77 mmHg  Pulse 69  Temp(Src) 98 F (36.7 C) (Oral)  Resp 18  SpO2 96% Physical Exam  Constitutional: He appears well-developed.  Cardiovascular: Normal rate.   Pulmonary/Chest:  Mildly diffusely harsh breath sounds, worse on the right.  Abdominal: Soft. There is no tenderness.  Musculoskeletal: Normal range of motion.  Neurological: He is alert.  Patient is blind. Moves all extremities. Slight slurring of speech. Face is symmetric. Good grip strength bilaterally.  Skin: Skin is warm.    ED Course  Procedures (including  critical care time) Labs Review Labs Reviewed  COMPREHENSIVE METABOLIC PANEL - Abnormal; Notable for the following:    Glucose, Bld 132 (*)    All other components within normal limits  URINALYSIS, ROUTINE W REFLEX MICROSCOPIC (NOT AT [facility]) - Abnormal; Notable for the following:    Hgb urine dipstick TRACE (*)    Ketones, ur 15 (*)    Leukocytes, UA MODERATE (*)    All other components within normal limits  URINE MICROSCOPIC-ADD ON - Abnormal; Notable for the following:    Squamous Epithelial / LPF 0-5 (*)    All other components within normal limits  URINE CULTURE  CBC    Imaging Review Dg Chest 2 View  03/11/2016  CLINICAL DATA:  Coughing and wheezing. EXAM: CHEST  2 VIEW COMPARISON:   01/24/2016 and 01/14/2016 FINDINGS: The tortuosity and calcification of the thoracic aorta. Overall heart size and pulmonary vascularity are normal. Chronic elevation of the right hemidiaphragm with secondary slight chronic atelectasis posteriorly at the right lung base. No acute infiltrates or effusions. Hepatic flexure of the colon lies anterior to the liver. Large hiatal hernia is chronic.  No acute bony abnormality. IMPRESSION: No acute abnormality. Aortic atherosclerosis. Large hiatal hernia. Chronic atelectasis at the right lung base posteriorly. Electronically Signed   By: Francene BoyersJames  Maxwell M.D.   On: 03/11/2016 14:03   I have personally reviewed and evaluated these images and lab results as part of my medical decision-making.   EKG Interpretation   Date/Time:  Monday Mar 11 2016 12:16:09 EDT Ventricular Rate:  68 PR Interval:  118 QRS Duration: 98 QT Interval:  408 QTC Calculation: 433 R Axis:   -64 Text Interpretation:  Normal sinus rhythm with sinus arrhythmia Incomplete  right bundle branch block Left anterior fascicular block Abnormal ECG  Confirmed by Rubin PayorPICKERING  MD, Harrold DonathNATHAN [phone]) on 03/11/2016 12:56:15 PM      MDM   Final diagnoses:  Unsteadiness  Weakness  Dementia, without behavioral disturbance    Patient with unsteadiness. He had to grab onto door. Lab work reassuring. Recent head CTs and MRIs. States it feels somewhat better. Urine shows white cells without bacteria. Culture sent. Awaiting orthostatics and ambulation with possible discharge home.    Benjiman CoreNathan Alazia Crocket, MD 03/11/16 1620

## 2016-03-11 NOTE — ED Provider Notes (Signed)
17:10- follow-up after ambulation trial. Able to ambulate with assistance. I discussed the case with case management, who will help arrange for in-home healthcare and a wheelchair.  Patient's wife states that he has been able to walk, as usual, here. I have ordered a walker for the patient at the wife's request.  Patient stable for discharge with outpatient follow-up and treatment by his PCP.  Mancel BaleElliott Welton Bord, MD 03/11/16 [phone]

## 2016-03-13 LAB — URINE CULTURE: CULTURE: NO GROWTH

## 2016-03-15 ENCOUNTER — Encounter: Payer: Self-pay | Admitting: *Deleted

## 2016-03-15 ENCOUNTER — Other Ambulatory Visit: Payer: Self-pay | Admitting: *Deleted

## 2016-03-15 NOTE — Patient Outreach (Signed)
[facility] [address] [facility]) Care Management  03/15/2016  Richard Lawson 1932-03-02 [id number]   ED Follow up call  RN spoke with pt today who provided permission for RN to speak with his caregiver wife (Diane). RN introduce the [facility] program and related services along with the purpose for today's call. Caregiver wife recite some of the pt's issues and the progress she has made with arranging certain placement of equipment into the home to accommodate the pt's needs. Also states DME that currently will not fit into the bath-tube but caregiver will contact the agency and request a small shower chair for pt. States pt doing much better and upon the discharge date felt pt did not need any HHealth services or anyone to visit in the home. Caregiver continues to feels no need for home visits as RN offered with ongoing education to both the pt and caregiver wife on the pt's medical condition and how to manage acute issues in the home. Pt appreciative but opt to decline case management services at this time. RN offered possible resources to assist her in the care for the pt as wife receptive to the [facility] and a calender that will be requested for this pt to be mailed. RN left contact number if any needs should arise for this pt in the future as caregiver very grateful as the call ended. Pt aware no addition al call will be made at this time due to the decline of services but [facility] available for ongoing referral is needed from any of the pt's providers or involved caregivers.  Patient was recently discharged from hospital and all medications have been reviewed.  [name] CousinLisa Charline Hoskinson, RN Care Management Coordinator [facility] Main Office [phone]

## 2016-03-18 ENCOUNTER — Encounter: Payer: Self-pay | Admitting: *Deleted

## 2016-03-20 ENCOUNTER — Ambulatory Visit (INDEPENDENT_AMBULATORY_CARE_PROVIDER_SITE_OTHER): Payer: Medicare Other | Admitting: Neurology

## 2016-03-20 ENCOUNTER — Encounter: Payer: Self-pay | Admitting: Neurology

## 2016-03-20 ENCOUNTER — Other Ambulatory Visit (INDEPENDENT_AMBULATORY_CARE_PROVIDER_SITE_OTHER): Payer: Medicare Other

## 2016-03-20 VITALS — BP 122/60 | HR 82 | Ht 66.0 in | Wt 180.0 lb

## 2016-03-20 DIAGNOSIS — G3183 Dementia with Lewy bodies: Secondary | ICD-10-CM | POA: Diagnosis not present

## 2016-03-20 DIAGNOSIS — F0280 Dementia in other diseases classified elsewhere without behavioral disturbance: Secondary | ICD-10-CM

## 2016-03-20 DIAGNOSIS — R55 Syncope and collapse: Secondary | ICD-10-CM | POA: Diagnosis not present

## 2016-03-20 DIAGNOSIS — F0390 Unspecified dementia without behavioral disturbance: Secondary | ICD-10-CM

## 2016-03-20 LAB — VITAMIN B12: Vitamin B-12: 1082 pg/mL — ABNORMAL HIGH (ref 211–911)

## 2016-03-20 LAB — TSH: TSH: 2.59 u[IU]/mL (ref 0.35–4.50)

## 2016-03-20 MED ORDER — MEMANTINE HCL 10 MG PO TABS: ORAL_TABLET | ORAL | Status: DC

## 2016-03-20 NOTE — Patient Instructions (Addendum)
1.  Start Namenda (memantine) 10mg  tablets.  Take 1/2 tablet at bedtime for 7 days, then 1/2 tablet twice daily for 7 days, then 1/2 tablet in morning and 1 tablet at bedtime for 7 days, then 1 tablet twice daily.   Side effects include dizziness, headache, diarrhea or constipation.  Call with any questions or concerns.  2.  We will set you up for a 24 hour ambulatory EEG 3.  We will check B12 and TSH.
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
## 2016-03-21 ENCOUNTER — Telehealth: Payer: Self-pay

## 2016-03-21 NOTE — Telephone Encounter (Signed)
-----   Message from Drema DallasAdam R Jaffe, DO sent at 03/20/2016  7:07 PM EDT ----- Labs are normal

## 2016-03-21 NOTE — Telephone Encounter (Signed)
Results were left on pt's voicemail, with instructions to call back with any questions or concerns in relation to results.   

## 2016-03-21 NOTE — Progress Notes (Signed)
Chart forwarded.  

## 2016-04-10 ENCOUNTER — Telehealth: Payer: Self-pay | Admitting: Neurology

## 2016-04-10 DIAGNOSIS — H547 Unspecified visual loss: Secondary | ICD-10-CM

## 2016-04-10 DIAGNOSIS — Z9989 Dependence on other enabling machines and devices: Secondary | ICD-10-CM

## 2016-04-10 DIAGNOSIS — G3183 Dementia with Lewy bodies: Principal | ICD-10-CM

## 2016-04-10 DIAGNOSIS — F0280 Dementia in other diseases classified elsewhere without behavioral disturbance: Secondary | ICD-10-CM

## 2016-04-10 DIAGNOSIS — G4733 Obstructive sleep apnea (adult) (pediatric): Secondary | ICD-10-CM

## 2016-04-10 NOTE — Telephone Encounter (Signed)
I returned pt's wife's call, no answer, left a message asking her to call me back.

## 2016-04-10 NOTE — Telephone Encounter (Signed)
Message For: OFFICE               Taken 31-MAY-17 at  2:01PM by JWT ------------------------------------------------------------  Richard Lawson  Richard Lawson               CID   [id number]   Patient  Richard Lawson         [facility]       Area Code  336  Phone#  [phone] *  DOB  [birth year]      RE  NEEDS TO TALK TO NURSE ABOUT PT C-PAP TESTS, IF   HE NEEDS IT OR NOT                                    Disp:Y/N  N  If Y = C/B If No Response In 20minutes
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
## 2016-04-11 NOTE — Telephone Encounter (Signed)
I spoke to pt's wife (per DPR). She reports that pt has not been using his cpap consistently for the past 9 weeks. Pt's wife has bought a pulse oximeter and has checked pt's oxygen daily while pt has been sleeping and it is consistently above 90%.  Pt's wife wants to know if pt should continue to use the cpap, and if he needs another sleep test to confirm if he still has apnea or not.

## 2016-04-11 NOTE — Telephone Encounter (Signed)
ONO order sent to [facility]. Advised them that the pt is blind.

## 2016-04-15 NOTE — Telephone Encounter (Signed)
Will need CPAP with and without o2. One night with and one night without.

## 2016-04-15 NOTE — Telephone Encounter (Signed)
Heather from [facility] called, wants to know what to do with the oxygen bled into pt's CPAP during the two ONOs ordered by Dr. Vickey Hugerohmeier.  Should the O2 continue to be bled into CPAP during the ONO on CPAP?  Should the O2 still be given to pt during his ONO on room air?

## 2016-04-16 NOTE — Addendum Note (Signed)
Addended by: Geronimo RunningINKINS, Aziza Stuckert A on: 04/16/2016 12:02 PM   Modules accepted: Orders

## 2016-04-16 NOTE — Telephone Encounter (Signed)
I spoke to Dr. Vickey Hugerohmeier and received clarification on the ONO orders. Dr. Vickey Hugerohmeier says that she wants an ONO on CPAP with oxygen. Then she wants an ONO on oxygen with no CPAP.

## 2016-04-17 NOTE — Telephone Encounter (Signed)
I called pt's wife to advise her of the ONOs. Pt's wife says that she is already aware and that Dr. Vickey Hugerohmeier already told her about both orders and how they were going to be done. Pt's wife is concerned that the pt will not be able to keep the probe on his finger, but is willing to try what Dr. Vickey Hugerohmeier has ordered.  Pt's wife states that pt had "berserk" behavior on Friday, and she took him to pt's PCP and ws diagnosed with an UTI, but pt has been "spitting out" the antibiotics prescribed by PCP.  Of note, pt's wife kept cutting me of and interrupting me during this phone call. It was very difficult to relay any information to her. The phone call lasted 10 minutes.

## 2016-04-17 NOTE — Telephone Encounter (Signed)
This encounter was created in error - please disregard.

## 2016-04-17 NOTE — Telephone Encounter (Signed)
Order cosigned by Dr. Vickey Hugerohmeier, order sent to [facility].

## 2016-04-22 ENCOUNTER — Telehealth: Payer: Self-pay | Admitting: Neurology

## 2016-04-22 DIAGNOSIS — G4733 Obstructive sleep apnea (adult) (pediatric): Secondary | ICD-10-CM

## 2016-04-22 NOTE — Telephone Encounter (Signed)
I had another lengthy conversation with pt's wife. [facility] should not continue CPAP because it is a burden and there is "no point." Pt has "flat out refused to wear it.". Pt's wife thinks that the 2 ONOs that Dr. Vickey Hugerohmeier has recommended are most likely "pointless" because pt refuses to wear any type of machine, cpap or oxygen at night.  Pt's wife wants to know if Dr. Vickey Hugerohmeier will agree to cancelling the ONOs and is ok with pt stopping the cpap. Pt's wife is asking the [facility] physicians to send their notes to Dr. Vickey Hugerohmeier for review.

## 2016-04-22 NOTE — Telephone Encounter (Signed)
Shirlee LimerickMarion, pts wife called about cpap overnight testing. [facility] is suggesting to not set up appt with our office. I transferred call to sleep lab

## 2016-04-23 NOTE — Telephone Encounter (Signed)
I called pt's wife to discuss. No answer, left a message asking her to call me back. 

## 2016-04-23 NOTE — Telephone Encounter (Signed)
I spoke to pt's wife and advised her that Dr. Vickey Hugerohmeier agreed to discontinue the cpap and the overnight oximetries. I also advised pt's wife that a follow up is not needed with Dr. Vickey Hugerohmeier. Pt's wife asked me to go ahead and cancel the December 2017 appt with Dr. Vickey Hugerohmeier. She will call [facility] to return the cpap equipment. I will also call [facility] and advise them of this. Pt's wife verbalized understanding. Pt's wife had no questions at this time but was encouraged to call back if questions arise.

## 2016-04-23 NOTE — Addendum Note (Signed)
Addended by: Geronimo RunningINKINS, Itati Brocksmith A on: 04/23/2016 03:52 PM   Modules accepted: Orders

## 2016-04-23 NOTE — Telephone Encounter (Signed)
Given that [facility] that CPAP is likely pointless and that his compliance will be very low I agreed to discontinue the CPAP and under the circumstances we don't need overnight oximetry. The patient does not need to follow-up in our sleep clinic.

## 2016-04-23 NOTE — Telephone Encounter (Signed)
Patient's wife is returning your call. Please call back on cell phone [phone].  Thanks!

## 2016-05-30 ENCOUNTER — Emergency Department (HOSPITAL_COMMUNITY): Payer: Medicare Other

## 2016-05-30 ENCOUNTER — Encounter (HOSPITAL_COMMUNITY): Payer: Self-pay | Admitting: Emergency Medicine

## 2016-05-30 ENCOUNTER — Inpatient Hospital Stay (HOSPITAL_COMMUNITY)
Admission: EM | Admit: 2016-05-30 | Discharge: 2016-06-03 | DRG: 177 | Disposition: A | Discharge status: Hospice | Payer: Medicare Other | Attending: Internal Medicine | Admitting: Internal Medicine

## 2016-05-30 DIAGNOSIS — E43 Unspecified severe protein-calorie malnutrition: Secondary | ICD-10-CM | POA: Diagnosis present

## 2016-05-30 DIAGNOSIS — R531 Weakness: Secondary | ICD-10-CM | POA: Diagnosis not present

## 2016-05-30 DIAGNOSIS — R778 Other specified abnormalities of plasma proteins: Secondary | ICD-10-CM

## 2016-05-30 DIAGNOSIS — Z66 Do not resuscitate: Secondary | ICD-10-CM | POA: Diagnosis present

## 2016-05-30 DIAGNOSIS — J690 Pneumonitis due to inhalation of food and vomit: Secondary | ICD-10-CM | POA: Diagnosis not present

## 2016-05-30 DIAGNOSIS — H547 Unspecified visual loss: Secondary | ICD-10-CM | POA: Diagnosis present

## 2016-05-30 DIAGNOSIS — Z515 Encounter for palliative care: Secondary | ICD-10-CM | POA: Diagnosis present

## 2016-05-30 DIAGNOSIS — R29898 Other symptoms and signs involving the musculoskeletal system: Secondary | ICD-10-CM | POA: Diagnosis present

## 2016-05-30 DIAGNOSIS — G4733 Obstructive sleep apnea (adult) (pediatric): Secondary | ICD-10-CM | POA: Diagnosis present

## 2016-05-30 DIAGNOSIS — F0391 Unspecified dementia with behavioral disturbance: Secondary | ICD-10-CM | POA: Diagnosis not present

## 2016-05-30 DIAGNOSIS — R7989 Other specified abnormal findings of blood chemistry: Secondary | ICD-10-CM

## 2016-05-30 DIAGNOSIS — Z6821 Body mass index (BMI) 21.0-21.9, adult: Secondary | ICD-10-CM

## 2016-05-30 DIAGNOSIS — Z9981 Dependence on supplemental oxygen: Secondary | ICD-10-CM

## 2016-05-30 DIAGNOSIS — F0281 Dementia in other diseases classified elsewhere with behavioral disturbance: Secondary | ICD-10-CM | POA: Diagnosis present

## 2016-05-30 DIAGNOSIS — H3552 Pigmentary retinal dystrophy: Secondary | ICD-10-CM | POA: Diagnosis present

## 2016-05-30 DIAGNOSIS — K219 Gastro-esophageal reflux disease without esophagitis: Secondary | ICD-10-CM | POA: Diagnosis present

## 2016-05-30 DIAGNOSIS — Z7901 Long term (current) use of anticoagulants: Secondary | ICD-10-CM

## 2016-05-30 DIAGNOSIS — E119 Type 2 diabetes mellitus without complications: Secondary | ICD-10-CM

## 2016-05-30 DIAGNOSIS — F0390 Unspecified dementia without behavioral disturbance: Secondary | ICD-10-CM | POA: Diagnosis present

## 2016-05-30 DIAGNOSIS — H540 Blindness, both eyes: Secondary | ICD-10-CM | POA: Diagnosis not present

## 2016-05-30 DIAGNOSIS — R0602 Shortness of breath: Secondary | ICD-10-CM

## 2016-05-30 DIAGNOSIS — R627 Adult failure to thrive: Secondary | ICD-10-CM | POA: Diagnosis present

## 2016-05-30 DIAGNOSIS — Z79899 Other long term (current) drug therapy: Secondary | ICD-10-CM

## 2016-05-30 DIAGNOSIS — I480 Paroxysmal atrial fibrillation: Secondary | ICD-10-CM | POA: Diagnosis present

## 2016-05-30 DIAGNOSIS — E876 Hypokalemia: Secondary | ICD-10-CM | POA: Diagnosis present

## 2016-05-30 DIAGNOSIS — Z9989 Dependence on other enabling machines and devices: Secondary | ICD-10-CM

## 2016-05-30 LAB — CBC WITH DIFFERENTIAL/PLATELET
BASOS ABS: 0.1 10*3/uL (ref 0.0–0.1)
BASOS PCT: 1 %
EOS PCT: 2 %
Eosinophils Absolute: 0.3 10*3/uL (ref 0.0–0.7)
HCT: 43 % (ref 39.0–52.0)
Hemoglobin: 13.9 g/dL (ref 13.0–17.0)
LYMPHS PCT: 10 %
Lymphs Abs: 1.3 10*3/uL (ref 0.7–4.0)
MCH: 29.8 pg (ref 26.0–34.0)
MCHC: 32.3 g/dL (ref 30.0–36.0)
MCV: 92.3 fL (ref 78.0–100.0)
MONO ABS: 1.4 10*3/uL — AB (ref 0.1–1.0)
Monocytes Relative: 11 %
Neutro Abs: 9.9 10*3/uL — ABNORMAL HIGH (ref 1.7–7.7)
Neutrophils Relative %: 76 %
PLATELETS: 294 10*3/uL (ref 150–400)
RBC: 4.66 MIL/uL (ref 4.22–5.81)
RDW: 13.3 % (ref 11.5–15.5)
WBC: 13 10*3/uL — ABNORMAL HIGH (ref 4.0–10.5)

## 2016-05-30 LAB — URINE MICROSCOPIC-ADD ON

## 2016-05-30 LAB — COMPREHENSIVE METABOLIC PANEL
ALT: 40 U/L (ref 17–63)
ANION GAP: 9 (ref 5–15)
AST: 53 U/L — ABNORMAL HIGH (ref 15–41)
Albumin: 3.6 g/dL (ref 3.5–5.0)
Alkaline Phosphatase: 87 U/L (ref 38–126)
BUN: 14 mg/dL (ref 6–20)
CHLORIDE: 103 mmol/L (ref 101–111)
CO2: 29 mmol/L (ref 22–32)
Calcium: 8.8 mg/dL — ABNORMAL LOW (ref 8.9–10.3)
Creatinine, Ser: 0.63 mg/dL (ref 0.61–1.24)
GFR calc Af Amer: >60 mL/min (ref >60)
Glucose, Bld: 152 mg/dL — ABNORMAL HIGH (ref 65–99)
POTASSIUM: 3.4 mmol/L — AB (ref 3.5–5.1)
Sodium: 141 mmol/L (ref 135–145)
Total Bilirubin: 1.3 mg/dL — ABNORMAL HIGH (ref 0.3–1.2)
Total Protein: 6.6 g/dL (ref 6.5–8.1)

## 2016-05-30 LAB — GLUCOSE, CAPILLARY
GLUCOSE-CAPILLARY: 123 mg/dL — AB (ref 65–99)
Glucose-Capillary: 127 mg/dL — ABNORMAL HIGH (ref 65–99)

## 2016-05-30 LAB — URINALYSIS, ROUTINE W REFLEX MICROSCOPIC
GLUCOSE, UA: 250 mg/dL — AB
Ketones, ur: NEGATIVE mg/dL
NITRITE: NEGATIVE
Protein, ur: 30 mg/dL — AB
Specific Gravity, Urine: 1.035 — ABNORMAL HIGH (ref 1.005–1.030)
pH: 5.5 (ref 5.0–8.0)

## 2016-05-30 LAB — TROPONIN I
TROPONIN I: 0.03 ng/mL — AB (ref <0.03)
TROPONIN I: 0.03 ng/mL — AB (ref <0.03)

## 2016-05-30 LAB — MRSA PCR SCREENING: MRSA by PCR: NEGATIVE

## 2016-05-30 MED ORDER — INSULIN ASPART 100 UNIT/ML ~~LOC~~ SOLN
0.0000 [IU] | Freq: Three times a day (TID) | SUBCUTANEOUS | Status: DC
  Administered 2016-05-31: 1 [IU] via SUBCUTANEOUS
  Administered 2016-05-31: 2 [IU] via SUBCUTANEOUS

## 2016-05-30 MED ORDER — EDOXABAN TOSYLATE 60 MG PO TABS
60.0000 mg | ORAL_TABLET | Freq: Every day | ORAL | Status: DC
Start: 2016-05-31 — End: 2016-06-02
  Administered 2016-05-31 – 2016-06-01 (×2): 60 mg via ORAL
  Filled 2016-05-30 (×3): qty 60

## 2016-05-30 MED ORDER — ALBUTEROL SULFATE (2.5 MG/3ML) 0.083% IN NEBU: 2.5000 mg | INHALATION_SOLUTION | RESPIRATORY_TRACT | Status: DC | PRN

## 2016-05-30 MED ORDER — ENSURE ENLIVE PO LIQD
237.0000 mL | Freq: Two times a day (BID) | ORAL | Status: DC
  Administered 2016-05-31: 237 mL via ORAL

## 2016-05-30 MED ORDER — HALOPERIDOL LACTATE 5 MG/ML IJ SOLN
2.0000 mg | Freq: Once | INTRAMUSCULAR | Status: AC
  Administered 2016-05-30: 2 mg via INTRAVENOUS
  Filled 2016-05-30: qty 1

## 2016-05-30 MED ORDER — POTASSIUM CHLORIDE IN NACL 40-0.9 MEQ/L-% IV SOLN
INTRAVENOUS | Status: AC
  Administered 2016-05-30: 75 mL/h via INTRAVENOUS
  Filled 2016-05-30 (×2): qty 1000

## 2016-05-30 MED ORDER — RISPERIDONE 1 MG/ML PO SOLN
1.0000 mg | Freq: Every day | ORAL | Status: DC | PRN
  Filled 2016-05-30: qty 1

## 2016-05-30 MED ORDER — MEMANTINE HCL 10 MG PO TABS
10.0000 mg | ORAL_TABLET | Freq: Two times a day (BID) | ORAL | Status: DC
Start: 2016-05-30 — End: 2016-06-02
  Administered 2016-05-31 – 2016-06-01 (×2): 10 mg via ORAL
  Filled 2016-05-30 (×6): qty 1

## 2016-05-30 MED ORDER — ACETAMINOPHEN 650 MG RE SUPP: 650.0000 mg | Freq: Four times a day (QID) | RECTAL | Status: DC | PRN

## 2016-05-30 MED ORDER — RISPERIDONE 1 MG/ML PO SOLN
2.0000 mg | Freq: Every day | ORAL | Status: DC
Start: 2016-05-30 — End: 2016-06-02
  Administered 2016-06-01: 2 mg via ORAL
  Filled 2016-05-30 (×3): qty 2

## 2016-05-30 MED ORDER — ACETAMINOPHEN 325 MG PO TABS: 650.0000 mg | ORAL_TABLET | Freq: Four times a day (QID) | ORAL | Status: DC | PRN

## 2016-05-30 MED ORDER — SODIUM CHLORIDE 0.9% FLUSH: 3.0000 mL | Freq: Two times a day (BID) | INTRAVENOUS | Status: DC

## 2016-05-30 MED ORDER — SODIUM CHLORIDE 0.9 % IV SOLN
INTRAVENOUS | Status: DC
  Administered 2016-05-30: 15:00:00 via INTRAVENOUS

## 2016-05-30 MED ORDER — TRAZODONE HCL 50 MG PO TABS
50.0000 mg | ORAL_TABLET | Freq: Every day | ORAL | Status: DC
  Administered 2016-06-01: 50 mg via ORAL
  Filled 2016-05-30 (×2): qty 1

## 2016-05-30 MED ORDER — METOPROLOL TARTRATE 25 MG PO TABS
12.5000 mg | ORAL_TABLET | Freq: Two times a day (BID) | ORAL | Status: DC
  Administered 2016-05-31 – 2016-06-03 (×4): 12.5 mg via ORAL
  Filled 2016-05-30 (×6): qty 1

## 2016-05-30 MED ORDER — VITAMIN B-12 1000 MCG PO TABS
1000.0000 ug | ORAL_TABLET | Freq: Every day | ORAL | Status: DC
  Administered 2016-05-31: 1000 ug via ORAL
  Filled 2016-05-30: qty 1

## 2016-05-30 NOTE — ED Notes (Signed)
Bed: [id number] Expected date:  Expected time:  Means of arrival:  Comments: EMS- 82yo M, difficulty walking

## 2016-05-30 NOTE — ED Notes (Signed)
PT CAN GO TO FLOOR AT [phone] 
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
## 2016-05-30 NOTE — Progress Notes (Signed)
Spoke with pt regarding cpap.  Pt states he doesn't wear it at home and refused to wear it here tonight.  Per family, the patient usually pulls it off at home and is unable to tolerate it.  Pt was advised that RT is available all night and encouraged him to call, should he change his mind.  RN aware.

## 2016-05-30 NOTE — Progress Notes (Addendum)
Patient arrived to unit at 1830.  Vital signs taken and patient settled.  Per MD patient is able to eat the soft diet prior to speech evaluation in the am.   Pt refusing orthostatic vital signs.  Unable to get up out of bed.

## 2016-05-30 NOTE — ED Notes (Signed)
Patient from assistant living, seeing primary Duane LopeAlan Ross, MD sent here because patient is now inappropriate for AL. Cannot stand and is unable to stand even with assistance. Wants patient to be admitted for thorough evaluation.

## 2016-05-30 NOTE — ED Provider Notes (Signed)
CSN: [id number]     Arrival date & time 05/30/16  1352 History   First MD Initiated Contact with Patient 05/30/16 1412     Chief Complaint  Patient presents with  . Difficulty Walking  . Knee Pain     (Consider location/radiation/quality/duration/timing/severity/associated sxs/prior Treatment) HPI Comments: Patient here from his physician's office after being seen for follow-up for weakness. Patient lives in assisted living and today could not stand on his own. Did have a fall several days ago. Has had bilateral knee pain since that time. Patient denies any recent changes to medications. Denies any fever, vomiting, diarrhea. No abdominal chest discomfort. Does endorse diffuse weakness. Pain is right knee is characterized as sharp and worse with standing. Denies any hip or back pain. Center for further evaluation.  Patient is a 82 y.o. male presenting with knee pain. The history is provided by the patient.  Knee Pain   Past Medical History  Diagnosis Date  . Retinitis pigmentosa of both eyes   . GERD (gastroesophageal reflux disease)   . Paroxysmal a-fib (HCC)   . Type II diabetes mellitus (HCC)   . Dementia     "not from CVA; unclear if it's Alzheimer's dementia at time" (01/12/2016)  . Lung infection 01/2015  . OSA on CPAP   . On home oxygen therapy     w/CPAP  . History of hiatal hernia   . Unresponsiveness 01/12/2016    "at home this am til he got here to ER; probably lasted ~ 2 hours"    Past Surgical History  Procedure Laterality Date  . Orif radial head / neck fracture Right 10/2009  . Fracture surgery    . Cataract extraction      "don't remember which side"  . Transurethral resection of prostate  02/2010   Family History  Problem Relation Age of Onset  . Lung cancer Father   . COPD Brother    Social History  Substance Use Topics  . Smoking status: Never Smoker   . Smokeless tobacco: Never Used  . Alcohol Use: No    Review of Systems  All other systems reviewed  and are negative.     Allergies  Metformin and Aricept  Home Medications   Prior to Admission medications   Medication Sig Start Date End Date Taking? Authorizing Provider  edoxaban (SAVAYSA) 60 MG TABS tablet Take 60 mg by mouth daily. Reported on 03/15/2016   Yes Historical Provider, MD  memantine (NAMENDA) 10 MG tablet Take 0.5 tab at bedtime for 7 days, then 0.5 tab twice daily for 7 days, then 1 tab twice daily Patient taking differently: Take 10 mg by mouth 2 (two) times daily.  03/20/16  Yes Adam Mliss Fritz, DO  metoprolol tartrate (LOPRESSOR) 25 MG tablet Take 0.5 tablets (12.5 mg total) by mouth 2 (two) times daily. Patient taking differently: Take 25 mg by mouth 2 (two) times daily.  01/16/16  Yes [name], MD  risperiDONE (RISPERDAL) 1 MG/ML oral solution Take 1 mg by mouth daily.    Yes Historical Provider, MD  traZODone (DESYREL) 50 MG tablet Take 50 mg by mouth at bedtime.  02/27/16  Yes Historical Provider, MD  vitamin B-12 (CYANOCOBALAMIN) 1000 MCG tablet Take 1,000 mcg by mouth daily.   Yes Historical Provider, MD   BP 125/74 mmHg  Pulse 90  Temp(Src) 98.3 F (36.8 C) (Oral)  Resp 17  SpO2 95% Physical Exam  Constitutional: He is oriented to person, place, and time. He  appears well-developed and well-nourished.  Non-toxic appearance. No distress.  HENT:  Head: Normocephalic and atraumatic.  Eyes: Conjunctivae, EOM and lids are normal. Pupils are equal, round, and reactive to light.  Neck: Normal range of motion. Neck supple. No tracheal deviation present. No thyroid mass present.  Cardiovascular: Normal rate, regular rhythm and normal heart sounds.  Exam reveals no gallop.   No murmur heard. Pulmonary/Chest: Effort normal and breath sounds normal. No stridor. No respiratory distress. He has no decreased breath sounds. He has no wheezes. He has no rhonchi. He has no rales.  Abdominal: Soft. Normal appearance and bowel sounds are normal. He exhibits no distension. There  is no tenderness. There is no rebound and no CVA tenderness.  Musculoskeletal: Normal range of motion. He exhibits no edema or tenderness.       Legs: Neurological: He is alert and oriented to person, place, and time. He has normal strength. No cranial nerve deficit or sensory deficit. GCS eye subscore is 4. GCS verbal subscore is 5. GCS motor subscore is 6.  Skin: Skin is warm and dry. No abrasion and no rash noted.  Psychiatric: His speech is normal and behavior is normal. His affect is blunt.  Nursing note and vitals reviewed.   ED Course  Procedures (including critical care time) Labs Review Labs Reviewed  URINE CULTURE  CBC WITH DIFFERENTIAL/PLATELET  COMPREHENSIVE METABOLIC PANEL  URINALYSIS, ROUTINE W REFLEX MICROSCOPIC (NOT AT [facility])    Imaging Review No results found. I have personally reviewed and evaluated these images and lab results as part of my medical decision-making.   EKG Interpretation   Date/Time:  Thursday May 30 2016 14:09:02 EDT Ventricular Rate:  91 PR Interval:    QRS Duration: 105 QT Interval:  366 QTC Calculation: 451 R Axis:   -59 Text Interpretation:  Sinus rhythm Left anterior fascicular block Consider  anterior infarct Minimal ST depression, lateral leads Confirmed by Thomasine Klutts   MD, Gerrad Welker ([id number]) on 05/30/2016 4:06:14 PM      MDM   Final diagnoses:  SOB (shortness of breath)    Head CT is without acute findings. Troponin is elevated. Discussed with hospitalist will admit for observation    Lorre NickAnthony Evelyne Makepeace, MD 05/30/16 1706

## 2016-05-31 DIAGNOSIS — H540 Blindness, both eyes: Secondary | ICD-10-CM | POA: Diagnosis present

## 2016-05-31 DIAGNOSIS — E43 Unspecified severe protein-calorie malnutrition: Secondary | ICD-10-CM | POA: Diagnosis present

## 2016-05-31 DIAGNOSIS — E119 Type 2 diabetes mellitus without complications: Secondary | ICD-10-CM | POA: Diagnosis present

## 2016-05-31 DIAGNOSIS — J690 Pneumonitis due to inhalation of food and vomit: Secondary | ICD-10-CM | POA: Diagnosis present

## 2016-05-31 DIAGNOSIS — R7989 Other specified abnormal findings of blood chemistry: Secondary | ICD-10-CM | POA: Diagnosis not present

## 2016-05-31 DIAGNOSIS — R627 Adult failure to thrive: Secondary | ICD-10-CM | POA: Diagnosis present

## 2016-05-31 DIAGNOSIS — H3552 Pigmentary retinal dystrophy: Secondary | ICD-10-CM

## 2016-05-31 DIAGNOSIS — Z79899 Other long term (current) drug therapy: Secondary | ICD-10-CM | POA: Diagnosis not present

## 2016-05-31 DIAGNOSIS — Z66 Do not resuscitate: Secondary | ICD-10-CM | POA: Diagnosis present

## 2016-05-31 DIAGNOSIS — F0281 Dementia in other diseases classified elsewhere with behavioral disturbance: Secondary | ICD-10-CM | POA: Diagnosis present

## 2016-05-31 DIAGNOSIS — Z7189 Other specified counseling: Secondary | ICD-10-CM | POA: Diagnosis not present

## 2016-05-31 DIAGNOSIS — R531 Weakness: Secondary | ICD-10-CM | POA: Diagnosis present

## 2016-05-31 DIAGNOSIS — E876 Hypokalemia: Secondary | ICD-10-CM | POA: Diagnosis present

## 2016-05-31 DIAGNOSIS — I480 Paroxysmal atrial fibrillation: Secondary | ICD-10-CM

## 2016-05-31 DIAGNOSIS — F0391 Unspecified dementia with behavioral disturbance: Secondary | ICD-10-CM | POA: Diagnosis not present

## 2016-05-31 DIAGNOSIS — Z515 Encounter for palliative care: Secondary | ICD-10-CM | POA: Diagnosis not present

## 2016-05-31 DIAGNOSIS — R1319 Other dysphagia: Secondary | ICD-10-CM | POA: Diagnosis not present

## 2016-05-31 DIAGNOSIS — G4733 Obstructive sleep apnea (adult) (pediatric): Secondary | ICD-10-CM | POA: Diagnosis present

## 2016-05-31 DIAGNOSIS — R29898 Other symptoms and signs involving the musculoskeletal system: Secondary | ICD-10-CM

## 2016-05-31 DIAGNOSIS — Z7901 Long term (current) use of anticoagulants: Secondary | ICD-10-CM | POA: Diagnosis not present

## 2016-05-31 DIAGNOSIS — K219 Gastro-esophageal reflux disease without esophagitis: Secondary | ICD-10-CM | POA: Diagnosis present

## 2016-05-31 DIAGNOSIS — Z6821 Body mass index (BMI) 21.0-21.9, adult: Secondary | ICD-10-CM | POA: Diagnosis not present

## 2016-05-31 DIAGNOSIS — Z9981 Dependence on supplemental oxygen: Secondary | ICD-10-CM | POA: Diagnosis not present

## 2016-05-31 LAB — GLUCOSE, CAPILLARY
GLUCOSE-CAPILLARY: 102 mg/dL — AB (ref 65–99)
Glucose-Capillary: 141 mg/dL — ABNORMAL HIGH (ref 65–99)
Glucose-Capillary: 160 mg/dL — ABNORMAL HIGH (ref 65–99)
Glucose-Capillary: 101 mg/dL — ABNORMAL HIGH (ref 65–99)

## 2016-05-31 LAB — BASIC METABOLIC PANEL
Anion gap: 9 (ref 5–15)
BUN: 13 mg/dL (ref 6–20)
CALCIUM: 8.3 mg/dL — AB (ref 8.9–10.3)
CO2: 24 mmol/L (ref 22–32)
CREATININE: 0.66 mg/dL (ref 0.61–1.24)
Chloride: 108 mmol/L (ref 101–111)
GFR calc Af Amer: >60 mL/min (ref >60)
Glucose, Bld: 138 mg/dL — ABNORMAL HIGH (ref 65–99)
Potassium: 3.2 mmol/L — ABNORMAL LOW (ref 3.5–5.1)
SODIUM: 141 mmol/L (ref 135–145)

## 2016-05-31 LAB — URINE CULTURE: Culture: NO GROWTH

## 2016-05-31 LAB — TROPONIN I
TROPONIN I: 0.03 ng/mL — AB (ref <0.03)
Troponin I: <0.03 ng/mL (ref <0.03)

## 2016-05-31 MED ORDER — DIPHENHYDRAMINE HCL 25 MG PO CAPS: 25.0000 mg | ORAL_CAPSULE | Freq: Four times a day (QID) | ORAL | Status: DC | PRN

## 2016-05-31 MED ORDER — SODIUM CHLORIDE 0.9 % IV SOLN: INTRAVENOUS | Status: DC

## 2016-05-31 MED ORDER — POTASSIUM CHLORIDE 10 MEQ/100ML IV SOLN
10.0000 meq | INTRAVENOUS | Status: AC
  Administered 2016-05-31 (×5): 10 meq via INTRAVENOUS
  Filled 2016-05-31 (×5): qty 100

## 2016-05-31 MED ORDER — POTASSIUM CHLORIDE IN NACL 40-0.9 MEQ/L-% IV SOLN
INTRAVENOUS | Status: DC
  Administered 2016-05-31 – 2016-06-02 (×4): 75 mL/h via INTRAVENOUS
  Filled 2016-05-31 (×5): qty 1000

## 2016-05-31 MED ORDER — POTASSIUM CHLORIDE CRYS ER 20 MEQ PO TBCR
40.0000 meq | EXTENDED_RELEASE_TABLET | Freq: Two times a day (BID) | ORAL | Status: DC
  Filled 2016-05-31: qty 2

## 2016-05-31 NOTE — Clinical Social Work Placement (Signed)
Patient has a bed at [facility]. CSW has completed FL2 & will continue to follow and assist with discharge when ready.    [name], LCSW [facility] Clinical Social Worker cell #: [phone]     CLINICAL SOCIAL WORK PLACEMENT  NOTE  Date:  05/31/2016  Patient Details  Name: Richard Lawson MRN: [id number] Date of Birth: 1932-11-11  Clinical Social Work is seeking post-discharge placement for this patient at the Skilled  Nursing Facility level of care (*CSW will initial, date and re-position this form in  chart as items are completed):  Yes   Patient/family provided with [facility] Clinical Social Work Department's list of facilities offering this level of care within the geographic area requested by the patient (or if unable, by the patient's family).  Yes   Patient/family informed of their freedom to choose among providers that offer the needed level of care, that participate in Medicare, Medicaid or managed care program needed by the patient, have an available bed and are willing to accept the patient.  Yes   Patient/family informed of [place]'s ownership interest in [facility] and [facility], as well as of the fact that they are under no obligation to receive care at these facilities.  PASRR submitted to EDS on 05/31/16     PASRR number received on 05/31/16     Existing PASRR number confirmed on       FL2 transmitted to all facilities in geographic area requested by pt/family on 05/31/16     FL2 transmitted to all facilities within larger geographic area on       Patient informed that his/her managed care company has contracts with or will negotiate with certain facilities, including the following:        Yes   Patient/family informed of bed offers received.  [facility]     Physician recommends and patient chooses bed at      [facility]  Patient to be  transferred to facility by       Patient family notified on   of transfer.  Name of family member notified:        PHYSICIAN       Additional Comment:    _______________________________________________ [name], Shandel Busic F, LCSW 05/31/2016, 5:31 PM

## 2016-05-31 NOTE — Progress Notes (Signed)
CRITICAL VALUE ALERT  Critical value received:  Troponin -.03  Date of notification:  05/31/2016  Time of notification:  0715  Critical value read back: yes  Nurse who received alert:  Byrd HesselbachMaria  MD notified (1st page):  Dr. Radonna RickerFeliz  Time of first page:  0728  MD notified (2nd page):  Time of second page:  Responding MD:    Time MD responded:

## 2016-05-31 NOTE — Progress Notes (Signed)
Patient urine output low.  MD made aware.  Will continue to monitor closely.

## 2016-05-31 NOTE — Evaluation (Signed)
Clinical/Bedside Swallow Evaluation Patient Details  Name: Richard Lawson MRN: [id number] Date of Birth: 21-Mar-1932  Today's Date: 05/31/2016 Time: SLP Start Time (ACUTE ONLY): 1215 SLP Stop Time (ACUTE ONLY): 1248 SLP Time Calculation (min) (ACUTE ONLY): 33 min  Past Medical History:  Past Medical History  Diagnosis Date  . Retinitis pigmentosa of both eyes   . GERD (gastroesophageal reflux disease)   . Paroxysmal a-fib (HCC)   . Type II diabetes mellitus (HCC)   . Dementia     "not from CVA; unclear if it's Alzheimer's dementia at time" (01/12/2016)  . Lung infection 01/2015  . OSA on CPAP   . On home oxygen therapy     w/CPAP  . History of hiatal hernia   . Unresponsiveness 01/12/2016    "at home this am til he got here to ER; probably lasted ~ 2 hours"    Past Surgical History:  Past Surgical History  Procedure Laterality Date  . Orif radial head / neck fracture Right 10/2009  . Fracture surgery    . Cataract extraction      "don't remember which side"  . Transurethral resection of prostate  02/2010   HPI:  82 [facility] male [facility] with weakness - PMH + for dementia, progressive weight loss - 40 pounds since January 2017 per wife, chronic cough, dysphagia, admit for weakness, falls and dysphagia in April 2017.  Pt CXR showed cardiomegaly and moderate HH.  Wife reports pt has had congested cough since January 2017.  Swallow evaluation ordered.       Assessment / Plan / Recommendation Clinical Impression  Pt currently has suspected severe dysphagia characterized by weakness and suspected aspiration of secretions (congested cough on secretions).    SLP presented pt with thin, nectar and icecream boluses.  Oral holding noted across all boluses due to poor awareness.  Use of hand over hand technique faciliated more efficient swallow.  Multiple swallows with delayed cough and expectoration of large amount of secretions (mixed with Ensure and chocolate) noted.  Fortunately, pt's cough  was strong but he is likely overtly aspirating po/secretions.  Pt did admit to sensation of lodging in throat before cough was elicited (with direct ? after coughing event).    Pt has a gross dysphagia with aspiration and if po is desired - it should be for comfort only with accepted risks.  Educated wife to findings, aspiration mitigation strategies and introduced concept of "comfort po".  Pt and wife desire for pt to eat for enjoyment despite aspiration risk per their statements.     Pt does not need an MBS at this time as it will not change his outcome given this has been progressive.  Spouse reports pt has been coughing since January and has lost significant weight.  She became tearful stating she can no longer care for him at home.  She deemed desire to change to DNR from full code status - this was relayed to MD via text message.       Aspiration Risk  Risk for inadequate nutrition/hydration;Severe aspiration risk    Diet Recommendation NPO (vs comfort po)   Liquid Administration via: Straw;Cup Medication Administration: Other (Comment) (defer to md = ? with applesauce vs alternative means) Supervision: Staff to assist with self feeding (if diet deemed indicated)    Other  Recommendations Oral Care Recommendations: Oral care before and after PO (oral suction before and after po)   Follow up Recommendations  None    Frequency  and Duration min 1 x/week  1 week       Prognosis Prognosis for Safe Diet Advancement: Guarded Barriers to Reach Goals: Time post onset;Severity of deficits;Cognitive deficits      Swallow Study   General Date of Onset: 05/31/16 HPI: 82 yo male adm to [facility] with weakness - PMH + for dementia, progressive weight loss - 40 pounds since January 2017 per wife, chronic cough, dysphagia, admit for weakness, falls and dysphagia in April 2017.  Pt CXR showed cardiomegaly and moderate HH.  Wife reports pt has had congested cough since January 2017.  Swallow  evaluation ordered.     Type of Study: Bedside Swallow Evaluation Diet Prior to this Study: Dysphagia 3 (soft);Thin liquids Temperature Spikes Noted: Yes Respiratory Status: Room air History of Recent Intubation: No Behavior/Cognition: Alert;Doesn't follow directions;Requires cueing (pt has dementia) Oral Cavity Assessment: Dry Oral Care Completed by SLP: No Oral Cavity - Dentition: Other (Comment) (dentition present) Vision: Impaired for self-feeding Self-Feeding Abilities: Total assist Patient Positioning: Upright in bed Baseline Vocal Quality: Suspected CN X (Vagus) involvement;Wet Volitional Cough: Strong (with aspiration episode) Volitional Swallow: Unable to elicit    Oral/Motor/Sensory Function Overall Oral Motor/Sensory Function: Mild impairment (generalized weakness)   Ice Chips Ice chips: Not tested   Thin Liquid Thin Liquid: Impaired Presentation: Straw;Self Fed Oral Phase Impairments: Poor awareness of bolus;Reduced lingual movement/coordination Oral Phase Functional Implications: Prolonged oral transit;Oral holding Pharyngeal  Phase Impairments: Multiple swallows;Cough - Delayed;Cough - Immediate;Suspected delayed Swallow;Decreased hyoid-laryngeal movement;Wet Vocal Quality Other Comments: pt helped to hold cup    Nectar Thick Nectar Thick Liquid: Impaired Presentation: Straw Oral Phase Impairments: Poor awareness of bolus;Reduced lingual movement/coordination Oral phase functional implications: Oral holding;Prolonged oral transit Pharyngeal Phase Impairments: Multiple swallows;Cough - Delayed;Suspected delayed Swallow;Decreased hyoid-laryngeal movement;Wet Vocal Quality Other Comments: pt helped to hold cup to administer liquids   Honey Thick Honey Thick Liquid: Not tested   Puree Puree: Impaired Presentation: Spoon Oral Phase Impairments: Poor awareness of bolus;Reduced lingual movement/coordination Oral Phase Functional Implications: Prolonged oral  transit Pharyngeal Phase Impairments: Cough - Delayed   Solid   GO   Solid: Not tested    Functional Assessment Tool Used: clinical judgement Functional Limitations: Swallowing Swallow Current Status (Z6109): At least 80 percent but less than 100 percent impaired, limited or restricted Swallow Goal Status [phone]): At least 80 percent but less than 100 percent impaired, limited or restricted   Donavan Burnet, MS [facility] SLP [phone]

## 2016-05-31 NOTE — Progress Notes (Signed)
Attempted to help pt. Stand for orthostatic vitals with assistance. Pt. Was unable to stand, legs were very weak. Unable to obtain orthostatic vitals

## 2016-05-31 NOTE — NC FL2 (Signed)
[place] MEDICAID FL2 LEVEL OF CARE SCREENING TOOL     IDENTIFICATION  Patient Name: Richard Lawson Birthdate: 12-16-1931 Sex: male Admission Date (Current Location): 05/30/2016  [facility] and [place] Number:  Producer, television/film/video and Address:  [facility],  [address], [place] [address]      Provider Number: [id number]  Attending Physician Name and Address:  Marinda Elk, MD  Relative Name and Phone Number:       Current Level of Care: Hospital Recommended Level of Care: Skilled Nursing Facility Prior Approval Number:    Date Approved/Denied:   PASRR Number: [id number]  Discharge Plan: Home    Current Diagnoses: Patient Active Problem List   Diagnosis Date Noted  . Elevated troponin 05/30/2016  . Weakness of both lower limbs 05/30/2016  . Failure to thrive in adult 05/30/2016  . Retinitis pigmentosa of both eyes 05/30/2016  . Protein-calorie malnutrition, severe 01/14/2016  . Altered mental status   . Syncope 01/12/2016  . Bradycardia 01/12/2016  . Dementia 01/12/2016  . Syncope and collapse 01/12/2016  . Abnormal chest x-ray with multiple nodules 04/19/2015  . OSA on CPAP 04/19/2015  . Blindness 04/19/2015  . Obstructive sleep apnea syndrome 03/06/2015  . Hyperglycemia 01/20/2015  . Hyponatremia 01/20/2015  . DM w/o complication type II (HCC) 01/20/2015  . Paroxysmal atrial fibrillation (HCC) 12/23/2014  . Hypoxemia requiring supplemental oxygen 12/23/2014  . Primary snoring 12/23/2014  . Retinitis pigmentosa, both eyes 12/23/2014    Orientation RESPIRATION BLADDER Height & Weight     Self  Normal Incontinent Weight: 155 lb 12.8 oz (70.67 kg) Height:  6' (182.9 cm)  BEHAVIORAL SYMPTOMS/MOOD NEUROLOGICAL BOWEL NUTRITION STATUS      Continent Diet (NPO at this time, please see d/c summary for diet upon discharge)  AMBULATORY STATUS COMMUNICATION OF NEEDS Skin   Extensive Assist Verbally Normal                        Personal Care Assistance Level of Assistance  Bathing, Dressing Bathing Assistance: Limited assistance   Dressing Assistance: Limited assistance     Functional Limitations Info             SPECIAL CARE FACTORS FREQUENCY  PT (By licensed PT), OT (By licensed OT)     PT Frequency: 5 OT Frequency: 5            Contractures      Additional Factors Info  Code Status, Allergies Code Status Info: DNR Allergies Info: Allergies:  Metformin, Aricept           Current Medications (05/31/2016):  This is the current hospital active medication list Current Facility-Administered Medications  Medication Dose Route Frequency Provider Last Rate Last Dose  . 0.9 % NaCl with KCl 40 mEq / L  infusion   Intravenous Continuous Marinda Elk, MD 75 mL/hr at 05/31/16 1057 75 mL/hr at 05/31/16 1057  . acetaminophen (TYLENOL) tablet 650 mg  650 mg Oral Q6H PRN Elease Etienne, MD       Or  . acetaminophen (TYLENOL) suppository 650 mg  650 mg Rectal Q6H PRN Elease Etienne, MD      . albuterol (PROVENTIL) (2.5 MG/3ML) 0.083% nebulizer solution 2.5 mg  2.5 mg Nebulization Q2H PRN Elease Etienne, MD      . edoxaban (SAVAYSA) tablet 60 mg  60 mg Oral Daily Elease Etienne, MD   60 mg at 05/31/16 0933  .  feeding supplement (ENSURE ENLIVE) (ENSURE ENLIVE) liquid 237 mL  237 mL Oral BID BM Elease EtienneAnand D Hongalgi, MD   237 mL at 05/31/16 1609  . insulin aspart (novoLOG) injection 0-9 Units  0-9 Units Subcutaneous TID WC Elease EtienneAnand D Hongalgi, MD   2 Units at 05/31/16 1257  . memantine (NAMENDA) tablet 10 mg  10 mg Oral BID Elease EtienneAnand D Hongalgi, MD   10 mg at 05/31/16 0857  . metoprolol tartrate (LOPRESSOR) tablet 12.5 mg  12.5 mg Oral BID Elease EtienneAnand D Hongalgi, MD   12.5 mg at 05/31/16 0857  . potassium chloride 10 mEq in 100 mL IVPB  10 mEq Intravenous Q1 Hr x 5 Marinda ElkAbraham Feliz Ortiz, MD   10 mEq at 05/31/16 1608  . risperiDONE (RISPERDAL) 1 MG/ML oral solution 1 mg  1 mg Oral Daily PRN Elease EtienneAnand D Hongalgi, MD       . risperiDONE (RISPERDAL) 1 MG/ML oral solution 2 mg  2 mg Oral QHS Elease EtienneAnand D Hongalgi, MD   2 mg at 05/30/16 2102  . sodium chloride flush (NS) 0.9 % injection 3 mL  3 mL Intravenous Q12H Elease EtienneAnand D Hongalgi, MD   3 mL at 05/30/16 2102  . traZODone (DESYREL) tablet 50 mg  50 mg Oral QHS Elease EtienneAnand D Hongalgi, MD   50 mg at 05/30/16 2102  . vitamin B-12 (CYANOCOBALAMIN) tablet 1,000 mcg  1,000 mcg Oral Daily Elease EtienneAnand D Hongalgi, MD   1,000 mcg at 05/31/16 [id number]     Discharge Medications: Please see discharge summary for a list of discharge medications.  Relevant Imaging Results:  Relevant Lab Results:   Additional Information SSN: [id number]  [name], Chavy Avera F, LCSW

## 2016-05-31 NOTE — Care Management Note (Signed)
Case Management Note  Patient Details  Name: Richard MedinaDonald R Boman MRN: [id number] Date of Birth: 1932-04-30  Subjective/Objective: Noted PT recc SNF.CSW following.                   Action/Plan:   Expected Discharge Date:   (unknown)               Expected Discharge Plan:  Skilled Nursing Facility  In-House Referral:  Clinical Social Work  Discharge planning Services  CM Consult  Post Acute Care Choice:    Choice offered to:     DME Arranged:    DME Agency:     HH Arranged:    HH Agency:     Status of Service:  In process, will continue to follow  If discussed at Long Length of Stay Meetings, dates discussed:    Additional Comments:  Lanier ClamMahabir, Eulalia Ellerman, RN 05/31/2016, 3:43 PM

## 2016-05-31 NOTE — Clinical Social Work Note (Signed)
Clinical Social Work Assessment  Patient Details  Name: Richard Lawson MRN: [id number] Date of Birth: 26-Jun-1932  Date of referral:  05/31/16               Reason for consult:  Facility Placement                Permission sought to share information with:  Oceanographeracility Contact Representative Permission granted to share information::  Yes, Verbal Permission Granted  Name::        Agency::     Relationship::     Contact Information:     Housing/Transportation Living arrangements for the past 2 months:  Assisted Living Facility Source of Information:  Adult Children, Spouse Patient Interpreter Needed:  None Criminal Activity/Legal Involvement Pertinent to Current Situation/Hospitalization:  No - Comment as needed Significant Relationships:  Adult Children, Spouse Lives with:  Spouse Do you feel safe going back to the place where you live?  No Need for family participation in patient care:  Yes (Comment)  Care giving concerns:  CSW received consult that [facility] for respite care.    Social Worker assessment / plan:  CSW spoke with patient's wife & son, Richard Lawson re: discharge planning. Patient will need SNF, CSW explained that due to patient being here under "observation status" rather than "inpatient" that Medicare would not cover SNF stay. Patient's wife states that they would be interested in [facility], CSW confirmed with Richard Lawson at [facility] that they would be able to take patient under private pay - wife & son are agreeable.   Employment status:  Retired Health and safety inspectornsurance information:  [employer] PT Recommendations:  Skilled Nursing Facility Information / Referral to community resources:  Skilled Nursing Facility  Patient/Family's Response to care:  Patient's wife & son are agreeable with SNF, frustrated about observation status & Medicare not covering SNF, though CSW explained reasoning to best of my ability.   Patient/Family's  Understanding of and Emotional Response to Diagnosis, Current Treatment, and Prognosis:  Patient's wife & son were still concerned about patient aspirating & "how is he going to eat"? CSW noted that Palliative has been consulted, awaiting meeting.   Emotional Assessment Appearance:  Appears stated age Attitude/Demeanor/Rapport:    Affect (typically observed):    Orientation:  Oriented to Self Alcohol / Substance use:    Psych involvement (Current and /or in the community):     Discharge Needs  Concerns to be addressed:    Readmission within the last 30 days:    Current discharge risk:    Barriers to Discharge:      Richard Lawson, Richard Brann F, LCSW 05/31/2016, 5:24 PM

## 2016-05-31 NOTE — Progress Notes (Signed)
[facility] PROGRESS NOTE    Progress Note  Alease MedinaDonald R Nitschke  ZOX:[id number] DOB: [birth year] DOA: 05/30/2016 PCP:  Duane Lopeoss, Alan, MD     Brief Narrative:   Alease MedinaDonald R App is an 82 y.o. male past medical history of moderate dementia, blindness, chronic atrial fibrillation on oral anticoagulation who presents to the ED complaining of lower extremity weakness. He had been living at an assisted living facility for several weeks as the family was concerned about the wife's health, most of the history was obtained from the wife.  Assessment/Plan:   Weakness of both lower limbs: No focal deficit on physical exam, CT scan of the head showed no acute changes. There is likely due to deconditioning in combination with his dementia, physical therapy evaluation has been requested. Awaiting social worker input in regards to disposition.  Hypokalemia: Continue potassium supplementation IV and oral.  Paroxysmal atrial fibrillation (HCC) Rate controlled continue metoprolol and home dose of anticoagulation.  DM w/o complication type II (HCC): Great control continue sliding scale insulin.  OSA on CPAP: Continue C-pap at night.  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  Protein-calorie malnutrition, severe  Elevated troponin No report of chest pain, troponins continue to be flat no abnormal EKG changes that be concerning for an ischemic event.  Failure to thrive in adult Family will need to meet with palliative care.    DVT prophylaxis: On oral anticoagulant Family Communication: Code confirmed by wife.  Disposition Plan/Barrier to D/C: Hopefully in am Code Status:     Code Status Orders        Start     Ordered   05/30/16 1843  Full code   Continuous     05/30/16 1842    Code Status History    Date Active Date Inactive Code  Status Order ID Comments User Context   01/12/2016  8:12 PM 01/16/2016  6:35 PM Full Code [id number]  Russella DarAllison L Ellis, NP Inpatient   01/21/2015 12:11 AM 01/24/2015  3:38 PM Full Code [id number]  Therisa DoyneAnastassia Doutova, MD Inpatient    Advance Directive Documentation        Most Recent Value   Type of Advance Directive  Healthcare Power of Attorney, Living will   Pre-existing out of facility DNR order (yellow form or pink MOST form)     "MOST" Form in Place?          IV Access:    Peripheral IV   Procedures and diagnostic studies:   Dg Chest 2 View  05/30/2016  CLINICAL DATA:  Shortness of Breath EXAM: CHEST  2 VIEW COMPARISON:  03/11/2016 FINDINGS: Cardiomediastinal silhouette is stable. Elevation of the right hemidiaphragm again noted. No infiltrate or pulmonary edema. Moderate size hiatal hernia again noted. Atherosclerotic calcifications of thoracic aorta. Degenerative changes bilateral AC joints. IMPRESSION: Cardiomegaly. Chronic elevation of the right hemidiaphragm. Moderate size hiatal hernia. No active disease. Electronically Signed   By: Natasha MeadLiviu  Pop M.D.   On: 05/30/2016 15:37   Ct Head Wo Contrast  05/30/2016  CLINICAL DATA:  The patient has recently become unable to stand or walk. EXAM: CT HEAD WITHOUT CONTRAST TECHNIQUE: Contiguous axial images were obtained from the base of the skull through the vertex without intravenous contrast. COMPARISON:  Head CT scan 02/14/2016.  Brain MRI 01/24/2016. FINDINGS: Cortical atrophy is identified and not notably changed. No evidence of acute intracranial  abnormality including hemorrhage, infarct, mass lesion, mass effect, midline shift or abnormal extra-axial fluid collection. No hydrocephalus or pneumocephalus. The calvarium is intact. Imaged paranasal sinuses and mastoid air cells are clear. IMPRESSION: No acute abnormality. Cortical atrophy is unchanged in appearance. Electronically Signed   By: Drusilla Kanner M.D.   On: 05/30/2016 15:34   Dg Knee  Complete 4 Views Left  05/30/2016  CLINICAL DATA:  Altered mental status EXAM: LEFT KNEE - COMPLETE 4+ VIEW COMPARISON:  None. FINDINGS: Four views of the left knee submitted. No acute fracture or subluxation. Mild spurring of medial tibial plateau. Minimal narrowing of patellofemoral joint space. No joint effusion. IMPRESSION: No acute fracture or subluxation.  Mild degenerative changes. Electronically Signed   By: Natasha Mead M.D.   On: 05/30/2016 15:35   Dg Knee Complete 4 Views Right  05/30/2016  CLINICAL DATA:  The patient is unable to stand, a recent development. Altered mental status. EXAM: RIGHT KNEE - COMPLETE 4+ VIEW COMPARISON:  Plain films of the right knee 10/31/2009. FINDINGS: No acute bony or joint abnormality is identified. Degenerative change about the knee is worst in the medial compartment has progressed somewhat since the prior examination. Small ossification off the medial epicondyle of the femur is again seen and consistent with old MCL injury. No joint effusion. IMPRESSION: No acute abnormality. Some progression of osteoarthritis about the knee which appears worst in the medial compartment. Electronically Signed   By: Drusilla Kanner M.D.   On: 05/30/2016 15:36     Medical Consultants:    None.  Anti-Infectives:   none  Subjective:    Alease Medina he has no complains.  Objective:    Filed Vitals:   05/30/16 1449 05/30/16 1843 05/30/16 2035 05/31/16 0700  BP: 125/74 131/73 123/75 125/70  Pulse: 88 89 93 89  Temp: 99.5 F (37.5 C) 98.4 F (36.9 C) 97.4 F (36.3 C) 98 F (36.7 C)  TempSrc: Rectal Axillary Oral Oral  Resp: 15 18 16 18   Height:   6' (1.829 m)   Weight:   70.67 kg (155 lb 12.8 oz)   SpO2: 96% 94% 96% 97%    Intake/Output Summary (Last 24 hours) at 05/31/16 0847 Last data filed at 05/30/16 2300  Gross per 24 hour  Intake 176.25 ml  Output    350 ml  Net -173.75 ml   Filed Weights   05/30/16 2035  Weight: 70.67 kg (155 lb 12.8 oz)      Exam: General exam: In no acute distress. Respiratory system: Good air movement and clear to auscultation. Cardiovascular system: S1 & S2 heard, RRR.  Gastrointestinal system: Abdomen is nondistended, soft and nontender.  Central nervous system: Alert and oriented. No focal neurological deficits. Extremities: No pedal edema. Skin: No rashes, lesions or ulcers Psychiatry: Judgement and insight appear normal. Mood & affect appropriate.    Data Reviewed:    Labs: Basic Metabolic Panel:  Recent Labs Lab 05/30/16 1416 05/31/16 0058  NA 141 141  K 3.4* 3.2*  CL 103 108  CO2 29 24  GLUCOSE 152* 138*  BUN 14 13  CREATININE 0.63 0.66  CALCIUM 8.8* 8.3*   GFR Estimated Creatinine Clearance: 71.2 mL/min (by C-G formula based on Cr of 0.66). Liver Function Tests:  Recent Labs Lab 05/30/16 1416  AST 53*  ALT 40  ALKPHOS 87  BILITOT 1.3*  PROT 6.6  ALBUMIN 3.6   No results for input(s): LIPASE, AMYLASE in the last 168 hours. No results  for input(s): AMMONIA in the last 168 hours. Coagulation profile No results for input(s): INR, PROTIME in the last 168 hours.  CBC:  Recent Labs Lab 05/30/16 1416  WBC 13.0*  NEUTROABS 9.9*  HGB 13.9  HCT 43.0  MCV 92.3  PLT 294   Cardiac Enzymes:  Recent Labs Lab 05/30/16 1416 05/30/16 2035 05/31/16 0058 05/31/16 0621  TROPONINI 0.03* 0.03* <0.03 0.03*   BNP (last 3 results) No results for input(s): PROBNP in the last 8760 hours. CBG:  Recent Labs Lab 05/30/16 1844 05/30/16 2117 05/31/16 0819  GLUCAP 123* 127* 141*   D-Dimer: No results for input(s): DDIMER in the last 72 hours. Hgb A1c: No results for input(s): HGBA1C in the last 72 hours. Lipid Profile: No results for input(s): CHOL, HDL, LDLCALC, TRIG, CHOLHDL, LDLDIRECT in the last 72 hours. Thyroid function studies: No results for input(s): TSH, T4TOTAL, T3FREE, THYROIDAB in the last 72 hours.  Invalid input(s): FREET3 Anemia work up: No results  for input(s): VITAMINB12, FOLATE, FERRITIN, TIBC, IRON, RETICCTPCT in the last 72 hours. Sepsis Labs:  Recent Labs Lab 05/30/16 1416  WBC 13.0*   Microbiology Recent Results (from the past 240 hour(s))  MRSA PCR Screening     Status: None   Collection Time: 05/30/16  8:49 PM  Result Value Ref Range Status   MRSA by PCR NEGATIVE NEGATIVE Final    Comment:        The GeneXpert MRSA Assay (FDA approved for NASAL specimens only), is one component of a comprehensive MRSA colonization surveillance program. It is not intended to diagnose MRSA infection nor to guide or monitor treatment for MRSA infections.      Medications:   . edoxaban  60 mg Oral Daily  . feeding supplement (ENSURE ENLIVE)  237 mL Oral BID BM  . insulin aspart  0-9 Units Subcutaneous TID WC  . memantine  10 mg Oral BID  . metoprolol tartrate  12.5 mg Oral BID  . risperiDONE  2 mg Oral QHS  . sodium chloride flush  3 mL Intravenous Q12H  . traZODone  50 mg Oral QHS  . vitamin B-12  1,000 mcg Oral Daily   Continuous Infusions: . 0.9 % NaCl with KCl 40 mEq / L 75 mL/hr (05/30/16 2039)    Time spent: 25 min     FELIZ Rosine Beat  [facility] Pager [phone]  *Please refer to [email or web address], password [email or web address] to get updated schedule on who will round on this patient, as hospitalists switch teams weekly. If 7PM-7AM, please contact night-coverage at [email or web address], password [email or web address] for any overnight needs.  05/31/2016, 8:47 AM

## 2016-05-31 NOTE — Care Management Note (Signed)
Case Management Note  Patient Details  Name: Richard Lawson MRN: [id number] Date of Birth: 1932-05-03  Subjective/Objective: From ALF-[facility]. PT-cons-Await PT recc. Spoke to spouse who prefers SNF.CSW following.                   Action/Plan:d/c plan ALF/SNF.    Expected Discharge Date:   (unknown)               Expected Discharge Plan:  Assisted Living / Rest Home  In-House Referral:  Clinical Social Work  Discharge planning Services  CM Consult  Post Acute Care Choice:    Choice offered to:     DME Arranged:    DME Agency:     HH Arranged:    HH Agency:     Status of Service:  In process, will continue to follow  If discussed at Long Length of Stay Meetings, dates discussed:    Additional Comments:  Lanier ClamMahabir, Annasophia Crocker, RN 05/31/2016, 3:41 PM

## 2016-05-31 NOTE — Evaluation (Signed)
Occupational Therapy Evaluation Patient Details Name: Richard Lawson MRN: [id number] DOB: May 25, 1932 Today's Date: 05/31/2016    History of Present Illness This 82 year old man was admitted with bil weakness.  He has a h/o moderate dementia, blindness, AFib and DM.   Clinical Impression   Pt was admitted for the above. Will follow in acute setting with max A level goals +1-2.  Unsure of pt's PLOF.  Pt will likely need SNF or home with 24/7 and increased assistance from caregivers    Follow Up Recommendations  SNF (if home, needs 24/7)    Equipment Recommendations  None recommended by OT    Recommendations for Other Services       Precautions / Restrictions Precautions Precautions: Fall Restrictions Weight Bearing Restrictions: No      Mobility Bed Mobility Overal bed mobility: +2 for physical assistance       Supine to sit: Total assist;+2 for physical assistance Sit to supine: Mod assist;+2 for safety/equipment   General bed mobility comments: did not assist with getting to EOB; actually put legs back on bed.  Did this also when ready to lie down  Transfers                 General transfer comment: not attempted    Balance Overall balance assessment: Needs assistance Sitting-balance support: Bilateral upper extremity supported Sitting balance-Leahy Scale:  (poor to zero) Sitting balance - Comments: pt pushing back posteriorly when at EOB                                    ADL Overall ADL's : Needs assistance/impaired Eating/Feeding: NPO   Grooming: Maximal assistance;Wash/dry face;Bed level                                 General ADL Comments: pt needs total A for adls.  Unable to assess feeding as he is NPO at this time.  He did wipe face, but just a little, and said that's all it needed. Sat EOB with +2 assistance.  He pushed posteriorly.      Vision Additional Comments: blind   Perception     Praxis       Pertinent Vitals/Pain Pain Assessment: Faces Pain Score: 0-No pain Faces Pain Scale: No hurt     Hand Dominance     Extremity/Trunk Assessment Upper Extremity Assessment Upper Extremity Assessment: Overall WFL for tasks assessed (grossly 4-/5; end range of shoulders tight)           Communication Communication Communication: No difficulties   Cognition Arousal/Alertness: Awake/alert Behavior During Therapy: WFL for tasks assessed/performed Overall Cognitive Status: No family/caregiver present to determine baseline cognitive functioning (h/o moderate dementia)                     General Comments       Exercises       Shoulder Instructions      Home Living Family/patient expects to be discharged to:: Skilled nursing facility Living Arrangements: Spouse/significant other                                      Prior Functioning/Environment          Comments: pt unable to report amount of  assistance needed.  Per chart, they lived in ALF and wife had caregivers assist pt    OT Diagnosis: Generalized weakness   OT Problem List: Decreased activity tolerance;Decreased cognition;Decreased safety awareness;Impaired balance (sitting and/or standing)   OT Treatment/Interventions: Self-care/ADL training;DME and/or AE instruction;Patient/family education;Balance training;Cognitive remediation/compensation;Therapeutic activities    OT Goals(Current goals can be found in the care plan section) Acute Rehab OT Goals Patient Stated Goal: none stated; agreeable to OT/PT OT Goal Formulation: Patient unable to participate in goal setting Time For Goal Achievement: 06/07/16 Potential to Achieve Goals: Fair ADL Goals Pt Will Perform Grooming: with max assist;sitting;bed level Pt Will Perform Upper Body Bathing: with max assist;sitting;bed level Pt Will Transfer to Toilet: with max assist;with +2 assist;stand pivot transfer;bedside commode Additional ADL  Goal #1: pt will sit eob with min A +2 safety for 3 minutes in preparation for adls Additional ADL Goal #2: pt will follow 90% one step functional commands in context with multimodal cues  OT Frequency: Min 1X/week   Barriers to D/C:            Co-evaluation PT/OT/SLP Co-Evaluation/Treatment: Yes Reason for Co-Treatment: For patient/therapist safety PT goals addressed during session: Mobility/safety with mobility OT goals addressed during session: ADL's and self-care      End of Session    Activity Tolerance: Patient tolerated treatment well Patient left: in bed;with call bell/phone within reach;with bed alarm set;with restraints reapplied   Time: 1610-9604 OT Time Calculation (min): 16 min Charges:  OT General Charges $OT Visit: 1 Procedure OT Evaluation $OT Eval Low Complexity: 1 Procedure G-Codes: OT G-codes **NOT FOR INPATIENT CLASS** Functional Assessment Tool Used: clinical observation and judgment Functional Limitation: Self care Self Care Current Status (V4098): At least 80 percent but less than 100 percent impaired, limited or restricted Self Care Goal Status (J1914): At least 60 percent but less than 80 percent impaired, limited or restricted  [facility] 05/31/2016, 10:52 AM  Marica Otter, OTR/L [phone] 05/31/2016

## 2016-05-31 NOTE — Procedures (Signed)
Pt does not wish to wear cpap will inform RT if anything changes. 

## 2016-05-31 NOTE — Care Management Note (Signed)
Case Management Note  Patient Details  Name: Richard Lawson MRN: [id number] Date of Birth: 15-Mar-1932  Subjective/Objective:82 y/o m admitted w/weakness. From home w/spouse. Received referral for asst @ home-PT cons-await recc.                    Action/Plan:d/c plan home.   Expected Discharge Date:   (unknown)               Expected Discharge Plan:  Home w Home Health Services  In-House Referral:  Clinical Social Work  Discharge planning Services  CM Consult  Post Acute Care Choice:    Choice offered to:     DME Arranged:    DME Agency:     HH Arranged:    HH Agency:     Status of Service:  In process, will continue to follow  If discussed at Long Length of Stay Meetings, dates discussed:    Additional Comments:  Lanier ClamMahabir, Ashdon Gillson, RN 05/31/2016, 9:51 AM

## 2016-05-31 NOTE — Evaluation (Addendum)
Physical Therapy Evaluation Patient Details Name: MAYFORD ALBERG MRN: [id number] DOB: 1931/11/22 Today's Date: 05/31/2016   History of Present Illness  This 82 year old man was admitted with bil weakness.  He has a h/o moderate dementia, blindness, AFib and DM.  Clinical Impression  Pt admitted with above diagnosis. Pt currently with functional limitations due to the deficits listed below (see PT Problem List).  Pt will benefit from skilled PT to increase their independence and safety with mobility to allow discharge to the venue listed below.  Pt requiring increased assist for bed mobility and presents with strong posterior trunk lean upon sitting EOB.  Recommend SNF upon d/c. Pt coughed up large amount of sputum(?) upon sitting EOB.    Follow Up Recommendations SNF;Supervision/Assistance - 24 hour    Equipment Recommendations  Wheelchair (measurements PT);Wheelchair cushion (measurements PT);Hospital bed    Recommendations for Other Services       Precautions / Restrictions Precautions Precautions: Fall Precaution Comments: blindness Restrictions Weight Bearing Restrictions: No      Mobility  Bed Mobility Overal bed mobility: +2 for physical assistance;Needs Assistance Bed Mobility: Supine to Sit;Sit to Supine     Supine to sit: Total assist;+2 for physical assistance Sit to supine: Mod assist;+2 for safety/equipment   General bed mobility comments: did not assist with getting to EOB despite multimodal cues, actually put legs back on bed.  pt assisted with LEs onto bed upon return to supine  Transfers                 General transfer comment: not attempted for safety  Ambulation/Gait                Stairs            Wheelchair Mobility    Modified Rankin (Stroke Patients Only)       Balance Overall balance assessment: Needs assistance Sitting-balance support: Bilateral upper extremity supported;Feet supported Sitting balance-Leahy Scale:  Zero Sitting balance - Comments: pt pushing back posteriorly when at EOB, unable to achieve sitting upright without increased external assist Postural control: Posterior lean                                   Pertinent Vitals/Pain Pain Assessment: Faces Pain Score: 0-No pain Faces Pain Scale: No hurt    Home Living Family/patient expects to be discharged to:: Skilled nursing facility Living Arrangements: Spouse/significant other                    Prior Function           Comments: pt unable to report amount of assistance needed.  Per chart, Pt with recent hospitalization which he d/c to ALF, previously from home with wife and caregivers     Hand Dominance        Extremity/Trunk Assessment   Upper Extremity Assessment: Overall WFL for tasks assessed (grossly 4-/5; end range of shoulders tight)           Lower Extremity Assessment: Generalized weakness;Difficult to assess due to impaired cognition (able to initiate active movement however not achieve full AROM)         Communication   Communication: No difficulties (legally blind)  Cognition Arousal/Alertness: Awake/alert Behavior During Therapy: WFL for tasks assessed/performed Overall Cognitive Status: No family/caregiver present to determine baseline cognitive functioning       Memory: Decreased short-term memory  General Comments      Exercises        Assessment/Plan    PT Assessment Patient needs continued PT services  PT Diagnosis Difficulty walking;Generalized weakness   PT Problem List Decreased strength;Decreased activity tolerance;Decreased balance;Decreased mobility;Decreased cognition  PT Treatment Interventions DME instruction;Gait training;Functional mobility training;Patient/family education;Therapeutic activities;Therapeutic exercise;Balance training;Wheelchair mobility training   PT Goals (Current goals can be found in the Care Plan section)  Acute Rehab PT Goals Patient Stated Goal: none stated; agreeable to OT/PT PT Goal Formulation: Patient unable to participate in goal setting Time For Goal Achievement: 06/14/16 Potential to Achieve Goals: Fair    Frequency Min 3X/week   Barriers to discharge        Co-evaluation PT/OT/SLP Co-Evaluation/Treatment: Yes Reason for Co-Treatment: For patient/therapist safety PT goals addressed during session: Mobility/safety with mobility OT goals addressed during session: ADL's and self-care       End of Session   Activity Tolerance: Patient limited by fatigue Patient left: in bed;with call bell/phone within reach;with bed alarm set Nurse Communication: Mobility status    Functional Assessment Tool Used: Clinical judgement Functional Limitation: Mobility: Walking and moving around Mobility: Walking and Moving Around Current Status (Z6109(G8978): 100 percent impaired, limited or restricted Mobility: Walking and Moving Around Goal Status (U0454(G8979): At least 40 percent but less than 60 percent impaired, limited or restricted    Time: 0951-1006 PT Time Calculation (min) (ACUTE ONLY): 15 min   Charges:   PT Evaluation $PT Eval Moderate Complexity: 1 Procedure     PT G Codes:   PT G-Codes **NOT FOR INPATIENT CLASS** Functional Assessment Tool Used: Clinical judgement Functional Limitation: Mobility: Walking and moving around Mobility: Walking and Moving Around Current Status (U9811(G8978): 100 percent impaired, limited or restricted Mobility: Walking and Moving Around Goal Status (B1478(G8979): At least 40 percent but less than 60 percent impaired, limited or restricted    Ravan Schlemmer,KATHrine E 05/31/2016, 12:13 PM Zenovia JarredKati Orli Degrave, PT, DPT 05/31/2016 Pager: [phone]

## 2016-05-31 NOTE — Care Management Note (Signed)
Case Management Note  Patient Details  Name: Richard Lawson MRN: [id number] Date of Birth: 20-Feb-1932  Subjective/Objective:   [facility]  CSW following.               Action/Plan:[facility]   Expected Discharge Date:   (unknown)               Expected Discharge Plan:  Assisted Living / Rest Home  In-House Referral:  Clinical Social Work  Discharge planning Services  CM Consult  Post Acute Care Choice:    Choice offered to:     DME Arranged:    DME Agency:     HH Arranged:    HH Agency:     Status of Service:  In process, will continue to follow  If discussed at Long Length of Stay Meetings, dates discussed:    Additional Comments:  Lanier ClamMahabir, Vanda Waskey, RN 05/31/2016, 9:53 AM

## 2016-05-31 NOTE — Progress Notes (Signed)
Initial Nutrition Assessment  DOCUMENTATION CODES:   Severe malnutrition in context of acute illness/injury  INTERVENTION:  - Continue Ensure Enlive po BID, each supplement provides 350 kcal and 20 grams of protein - Continue to encourage PO intakes of meals and supplements. - Tech/RN to assist with meals as needed when family is not present. - RD will continue to monitor for POC/GOC and associated nutrition-related needs.  NUTRITION DIAGNOSIS:   Malnutrition related to acute illness as evidenced by percent weight loss, severe depletion of muscle mass, moderate depletions of muscle mass.  GOAL:   Patient will meet greater than or equal to 90% of their needs  MONITOR:   PO intake, Supplement acceptance, Weight trends, Labs, Skin, I & O's  REASON FOR ASSESSMENT:   Malnutrition Screening Tool  ASSESSMENT:   82 y.o. 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 Unable to obtain history from patient due to dementia. As per description by spouse, patient lived with her with assistance of caregivers at home up to a week ago. He had behavioral changes with intermittent agitation and aggressiveness. Approximately a week ago, family decided to place patient in [facility] assisted living facility for 2 weeks. Spouse however when they're almost on a daily basis and walked him by just holding his hand on last Saturday and 2 days ago. Today when they went to take him for his regular doctor's visit, he was noted to be weak and needed more assistance to lift him. On reaching PCPs office, he was unable to get off the wheelchair and required assistance of multiple personnel to get him up into the bed. He usually consumes soft food in small quantities. Has intermittent chronic cough. He has apparently  lost approximately 40 pounds since January of this year. Appetite is said to be usual.  Pt seen for MST. BMI indicates normal weight. No intakes documented since admission but visualized breakfast tray in the room with 50% completion of pancakes and scrambled eggs, 100% of a cup of orange juice; Ensure Enlive is in the room but unopened. No family/visitors present at this time.  Spoke with RN who states pt needs feeding assistance and that he did not have coughing during meal but did sound gurgley in chest with eating. RN reports pt is now NPO pending swallow evaluation. RD will continue to monitor for recommendations from SLP.   Pt was sleeping at time of visit and RD did not feel that it was necessary to awake him given hx of dementia. Did not perform physical assessment with respect for pt's comfort but able to visualize moderate to severe muscle wasting to temple and clavicle areas.  Per chart review, pt has lost 25 lbs (14% body weight) in the past 2 months which is significant for time frame.   Pt not meeting needs. Will continue to monitor for nutrition-related needs following SLP evaluation. Also noted Palliative consult in place.  Medications reviewed; sliding scale Novolog, 10 mEq IV KCl x5 runs, 1000 mcg oral vitamin B12/day. Labs reviewed; CBG: 141 mg/dL this AM, K: 3.2 mmol/L, Ca: 8.3 mg/dL. IVF: NS-40 mEq KCl @ 75 mL/hr.     Diet Order:  Diet NPO time specified  Skin:  Reviewed, no issues  Last BM:  PTA  Height:   Ht Readings from Last 1 Encounters:  05/30/16 6' (1.829  m)    Weight:   Wt Readings from Last 1 Encounters:  05/30/16 155 lb 12.8 oz (70.67 kg)    Ideal Body Weight:  80.91 kg (kg)  BMI:  Body mass index is 21.13 kg/(m^2).  Estimated Nutritional Needs:   Kcal:  1650-1850  Protein:  85-95 grams  Fluid:  1.8-2 L/day  EDUCATION NEEDS:   No education needs identified at this time    Trenton GammonJessica Miyah Hampshire, MS, RD, LDN Inpatient Clinical  Dietitian Pager # [phone] After hours/weekend pager # [phone]

## 2016-05-31 NOTE — Care Management Obs Status (Signed)
MEDICARE OBSERVATION STATUS NOTIFICATION   Patient Details  Name: Richard Lawson MRN: [id number] Date of Birth: 15-Sep-1932   Medicare Observation Status Notification Given:  Yes    MahabirOlegario Messier, Treavor Blomquist, RN 05/31/2016, 10:46 AM

## 2016-06-01 DIAGNOSIS — E43 Unspecified severe protein-calorie malnutrition: Secondary | ICD-10-CM

## 2016-06-01 LAB — GLUCOSE, CAPILLARY
GLUCOSE-CAPILLARY: 119 mg/dL — AB (ref 65–99)
GLUCOSE-CAPILLARY: 119 mg/dL — AB (ref 65–99)
Glucose-Capillary: 119 mg/dL — ABNORMAL HIGH (ref 65–99)
Glucose-Capillary: 105 mg/dL — ABNORMAL HIGH (ref 65–99)

## 2016-06-01 MED ORDER — MORPHINE SULFATE (PF) 2 MG/ML IV SOLN
2.0000 mg | INTRAVENOUS | Status: DC | PRN
  Administered 2016-06-01 – 2016-06-02 (×3): 2 mg via INTRAVENOUS
  Filled 2016-06-01 (×3): qty 1

## 2016-06-01 NOTE — Progress Notes (Signed)
I was referred to Mr Lenoir and his wife by the staff. Mr Baray was asleep and his wife had gone home to shower and take care of herself. Chaplain follow-up is indicated as important.  Page Lunette Stands if patient or family require or request spiritual and emotional care.  Benjie Karvonen. Kenji Mapel, DMin, MDiv, MA Chaplain

## 2016-06-01 NOTE — Progress Notes (Signed)
[facility] PROGRESS NOTE    Progress Note  Richard Lawson  NGE:[id number] DOB: 02/15/1932 DOA: 05/30/2016 PCP:  [name], MD     Brief Narrative:   Richard Lawson is an 82 y.o. male past medical history of moderate dementia, blindness, chronic atrial fibrillation on oral anticoagulation who presents to the ED complaining of lower extremity weakness. He had been living at an assisted living facility for several weeks as the family was concerned about the wife's health, most of the history was obtained from the wife.  Assessment/Plan:   Weakness of both lower limbs: No focal deficit on physical exam, CT scan of the head showed no acute changes. There is likely due to deconditioning in combination with his dementia, physical therapy evaluation has been requested. Awaiting social worker input in regards to disposition. PT eval pending.  Hypokalemia: Continue potassium supplementation IV and oral. Basic metabolic panels pending.  Paroxysmal atrial fibrillation (HCC) Rate controlled continue metoprolol and home dose of anticoagulation.  DM w/o complication type II (HCC): Great control continue sliding scale insulin.  OSA on CPAP: Continue C-pap at night.  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  Protein-calorie malnutrition, severe  Elevated troponin No report of chest pain, troponins continue to be flat no abnormal EKG changes that be concerning for an ischemic event.  Failure to thrive in adult Awaiting palliative Care recommendations    DVT prophylaxis: On oral anticoagulant Family Communication: Code confirmed by wife.  Disposition Plan/Barrier to D/C: Hopefully in am Code Status:     Code Status Orders        Start     Ordered   05/30/16 1843  Full code   Continuous     05/30/16 1842    Code Status  History    Date Active Date Inactive Code Status Order ID Comments User Context   01/12/2016  8:12 PM 01/16/2016  6:35 PM Full Code [id number]  Russella Dar, NP Inpatient   01/21/2015 12:11 AM 01/24/2015  3:38 PM Full Code [id number]  Therisa Doyne, MD Inpatient    Advance Directive Documentation        Most Recent Value   Type of Advance Directive  Healthcare Power of Attorney, Living will   Pre-existing out of facility DNR order (yellow form or pink MOST form)     "MOST" Form in Place?          IV Access:    Peripheral IV   Procedures and diagnostic studies:   Dg Chest 2 View  05/30/2016  CLINICAL DATA:  Shortness of Breath EXAM: CHEST  2 VIEW COMPARISON:  03/11/2016 FINDINGS: Cardiomediastinal silhouette is stable. Elevation of the right hemidiaphragm again noted. No infiltrate or pulmonary edema. Moderate size hiatal hernia again noted. Atherosclerotic calcifications of thoracic aorta. Degenerative changes bilateral AC joints. IMPRESSION: Cardiomegaly. Chronic elevation of the right hemidiaphragm. Moderate size hiatal hernia. No active disease. Electronically Signed   By: Natasha Mead M.D.   On: 05/30/2016 15:37   Ct Head Wo Contrast  05/30/2016  CLINICAL DATA:  The patient has recently become unable to stand or walk. EXAM: CT HEAD WITHOUT CONTRAST TECHNIQUE: Contiguous axial images were obtained from the base of the skull through the vertex without intravenous contrast. COMPARISON:  Head CT scan 02/14/2016.  Brain MRI 01/24/2016. FINDINGS: Cortical atrophy is identified and not notably changed. No evidence  of acute intracranial abnormality including hemorrhage, infarct, mass lesion, mass effect, midline shift or abnormal extra-axial fluid collection. No hydrocephalus or pneumocephalus. The calvarium is intact. Imaged paranasal sinuses and mastoid air cells are clear. IMPRESSION: No acute abnormality. Cortical atrophy is unchanged in appearance. Electronically Signed   By: Drusilla Kanner M.D.   On: 05/30/2016 15:34   Dg Knee Complete 4 Views Left  05/30/2016  CLINICAL DATA:  Altered mental status EXAM: LEFT KNEE - COMPLETE 4+ VIEW COMPARISON:  None. FINDINGS: Four views of the left knee submitted. No acute fracture or subluxation. Mild spurring of medial tibial plateau. Minimal narrowing of patellofemoral joint space. No joint effusion. IMPRESSION: No acute fracture or subluxation.  Mild degenerative changes. Electronically Signed   By: Natasha Mead M.D.   On: 05/30/2016 15:35   Dg Knee Complete 4 Views Right  05/30/2016  CLINICAL DATA:  The patient is unable to stand, a recent development. Altered mental status. EXAM: RIGHT KNEE - COMPLETE 4+ VIEW COMPARISON:  Plain films of the right knee 10/31/2009. FINDINGS: No acute bony or joint abnormality is identified. Degenerative change about the knee is worst in the medial compartment has progressed somewhat since the prior examination. Small ossification off the medial epicondyle of the femur is again seen and consistent with old MCL injury. No joint effusion. IMPRESSION: No acute abnormality. Some progression of osteoarthritis about the knee which appears worst in the medial compartment. Electronically Signed   By: Drusilla Kanner M.D.   On: 05/30/2016 15:36     Medical Consultants:    None.  Anti-Infectives:   none  Subjective:    Richard Lawson he has no complains.  Objective:    Filed Vitals:   05/31/16 0700 05/31/16 1510 05/31/16 2154 06/01/16 0646  BP: 125/70 122/63 156/81 139/70  Pulse: 89 87 100 108  Temp: 98 F (36.7 C) 98.8 F (37.1 C) 99.6 F (37.6 C) 99.9 F (37.7 C)  TempSrc: Oral Oral Axillary Axillary  Resp: 18 18 18 18   Height:      Weight:      SpO2: 97% 95% 88% 92%    Intake/Output Summary (Last 24 hours) at 06/01/16 1222 Last data filed at 06/01/16 0500  Gross per 24 hour  Intake 1083.75 ml  Output    550 ml  Net 533.75 ml   Filed Weights   05/30/16 2035  Weight: 70.67 kg  (155 lb 12.8 oz)    Exam: General exam: In no acute distress. Respiratory system: Good air movement and clear to auscultation. Cardiovascular system: S1 & S2 heard, RRR.  Gastrointestinal system: Abdomen is nondistended, soft and nontender.  Central nervous system: Alert and oriented. No focal neurological deficits. Extremities: No pedal edema. Skin: No rashes, lesions or ulcers    Data Reviewed:    Labs: Basic Metabolic Panel:  Recent Labs Lab 05/30/16 1416 05/31/16 0058  NA 141 141  K 3.4* 3.2*  CL 103 108  CO2 29 24  GLUCOSE 152* 138*  BUN 14 13  CREATININE 0.63 0.66  CALCIUM 8.8* 8.3*   GFR Estimated Creatinine Clearance: 71.2 mL/min (by C-G formula based on Cr of 0.66). Liver Function Tests:  Recent Labs Lab 05/30/16 1416  AST 53*  ALT 40  ALKPHOS 87  BILITOT 1.3*  PROT 6.6  ALBUMIN 3.6   No results for input(s): LIPASE, AMYLASE in the last 168 hours. No results for input(s): AMMONIA in the last 168 hours. Coagulation profile No results for input(s): INR,  PROTIME in the last 168 hours.  CBC:  Recent Labs Lab 05/30/16 1416  WBC 13.0*  NEUTROABS 9.9*  HGB 13.9  HCT 43.0  MCV 92.3  PLT 294   Cardiac Enzymes:  Recent Labs Lab 05/30/16 1416 05/30/16 2035 05/31/16 0058 05/31/16 0621  TROPONINI 0.03* 0.03* <0.03 0.03*   BNP (last 3 results) No results for input(s): PROBNP in the last 8760 hours. CBG:  Recent Labs Lab 05/31/16 0819 05/31/16 1148 05/31/16 1732 05/31/16 2149 06/01/16 0753  GLUCAP 141* 160* 102* 101* 119*   D-Dimer: No results for input(s): DDIMER in the last 72 hours. Hgb A1c: No results for input(s): HGBA1C in the last 72 hours. Lipid Profile: No results for input(s): CHOL, HDL, LDLCALC, TRIG, CHOLHDL, LDLDIRECT in the last 72 hours. Thyroid function studies: No results for input(s): TSH, T4TOTAL, T3FREE, THYROIDAB in the last 72 hours.  Invalid input(s): FREET3 Anemia work up: No results for input(s):  VITAMINB12, FOLATE, FERRITIN, TIBC, IRON, RETICCTPCT in the last 72 hours. Sepsis Labs:  Recent Labs Lab 05/30/16 1416  WBC 13.0*   Microbiology Recent Results (from the past 240 hour(s))  Urine culture     Status: None   Collection Time: 05/30/16  2:34 PM  Result Value Ref Range Status   Specimen Description URINE, CATHETERIZED  Final   Special Requests NONE  Final   Culture NO GROWTH Performed at [facility]   Final   Report Status 05/31/2016 FINAL  Final  MRSA PCR Screening     Status: None   Collection Time: 05/30/16  8:49 PM  Result Value Ref Range Status   MRSA by PCR NEGATIVE NEGATIVE Final    Comment:        The GeneXpert MRSA Assay (FDA approved for NASAL specimens only), is one component of a comprehensive MRSA colonization surveillance program. It is not intended to diagnose MRSA infection nor to guide or monitor treatment for MRSA infections.      Medications:   . edoxaban  60 mg Oral Daily  . feeding supplement (ENSURE ENLIVE)  237 mL Oral BID BM  . insulin aspart  0-9 Units Subcutaneous TID WC  . memantine  10 mg Oral BID  . metoprolol tartrate  12.5 mg Oral BID  . risperiDONE  2 mg Oral QHS  . sodium chloride flush  3 mL Intravenous Q12H  . traZODone  50 mg Oral QHS  . vitamin B-12  1,000 mcg Oral Daily   Continuous Infusions: . 0.9 % NaCl with KCl 40 mEq / L 75 mL/hr (06/01/16 0019)    Time spent: 15 min   LOS: 1 day   Marinda Elk  [facility] Pager [phone]  *Please refer to [email or web address], password [email or web address] to get updated schedule on who will round on this patient, as hospitalists switch teams weekly. If 7PM-7AM, please contact night-coverage at [email or web address], password [email or web address] for any overnight needs.  06/01/2016, 12:22 PM

## 2016-06-01 NOTE — Procedures (Signed)
Pt does not wish to wear cpap will inform RT if anything changes. 

## 2016-06-02 DIAGNOSIS — Z7189 Other specified counseling: Secondary | ICD-10-CM

## 2016-06-02 DIAGNOSIS — Z515 Encounter for palliative care: Secondary | ICD-10-CM

## 2016-06-02 DIAGNOSIS — R1319 Other dysphagia: Secondary | ICD-10-CM

## 2016-06-02 LAB — BASIC METABOLIC PANEL
Anion gap: 8 (ref 5–15)
BUN: 11 mg/dL (ref 6–20)
CO2: 21 mmol/L — ABNORMAL LOW (ref 22–32)
CREATININE: 0.61 mg/dL (ref 0.61–1.24)
Calcium: 8.4 mg/dL — ABNORMAL LOW (ref 8.9–10.3)
Chloride: 107 mmol/L (ref 101–111)
GFR calc Af Amer: >60 mL/min (ref >60)
GLUCOSE: 127 mg/dL — AB (ref 65–99)
Potassium: 4.7 mmol/L (ref 3.5–5.1)
SODIUM: 136 mmol/L (ref 135–145)

## 2016-06-02 LAB — GLUCOSE, CAPILLARY
GLUCOSE-CAPILLARY: 133 mg/dL — AB (ref 65–99)
GLUCOSE-CAPILLARY: 162 mg/dL — AB (ref 65–99)

## 2016-06-02 MED ORDER — LORAZEPAM 2 MG/ML IJ SOLN
0.5000 mg | Freq: Once | INTRAMUSCULAR | Status: AC
  Administered 2016-06-02: 0.5 mg via INTRAVENOUS

## 2016-06-02 MED ORDER — HALOPERIDOL 0.5 MG PO TABS
0.5000 mg | ORAL_TABLET | ORAL | Status: DC | PRN
Start: 2016-06-02 — End: 2016-06-03
  Filled 2016-06-02: qty 1

## 2016-06-02 MED ORDER — POLYVINYL ALCOHOL 1.4 % OP SOLN
1.0000 [drp] | Freq: Four times a day (QID) | OPHTHALMIC | Status: DC | PRN
  Filled 2016-06-02: qty 15

## 2016-06-02 MED ORDER — HALOPERIDOL LACTATE 5 MG/ML IJ SOLN
0.5000 mg | INTRAMUSCULAR | Status: DC | PRN
Start: 2016-06-02 — End: 2016-06-03

## 2016-06-02 MED ORDER — SODIUM CHLORIDE 0.9% FLUSH: 3.0000 mL | INTRAVENOUS | Status: DC | PRN

## 2016-06-02 MED ORDER — LORAZEPAM 2 MG/ML IJ SOLN
0.5000 mg | INTRAMUSCULAR | Status: DC | PRN
  Administered 2016-06-03: 0.5 mg via INTRAVENOUS
  Filled 2016-06-02 (×2): qty 1

## 2016-06-02 MED ORDER — ACETAMINOPHEN 325 MG PO TABS: 650.0000 mg | ORAL_TABLET | Freq: Four times a day (QID) | ORAL | Status: DC | PRN

## 2016-06-02 MED ORDER — MORPHINE SULFATE (PF) 2 MG/ML IV SOLN: 2.0000 mg | INTRAVENOUS | Status: DC | PRN

## 2016-06-02 MED ORDER — GLYCOPYRROLATE 1 MG PO TABS
1.0000 mg | ORAL_TABLET | ORAL | Status: DC | PRN
  Filled 2016-06-02: qty 1

## 2016-06-02 MED ORDER — BIOTENE DRY MOUTH MT LIQD: 15.0000 mL | OROMUCOSAL | Status: DC | PRN

## 2016-06-02 MED ORDER — ACETAMINOPHEN 650 MG RE SUPP
650.0000 mg | Freq: Four times a day (QID) | RECTAL | Status: DC | PRN
  Administered 2016-06-03: 650 mg via RECTAL
  Filled 2016-06-02: qty 1

## 2016-06-02 MED ORDER — HALOPERIDOL LACTATE 2 MG/ML PO CONC
0.5000 mg | ORAL | Status: DC | PRN
  Filled 2016-06-02: qty 0.3

## 2016-06-02 MED ORDER — ONDANSETRON 4 MG PO TBDP: 4.0000 mg | ORAL_TABLET | Freq: Four times a day (QID) | ORAL | Status: DC | PRN

## 2016-06-02 MED ORDER — ONDANSETRON HCL 4 MG/2ML IJ SOLN
4.0000 mg | Freq: Four times a day (QID) | INTRAMUSCULAR | Status: DC | PRN
Start: 2016-06-02 — End: 2016-06-03

## 2016-06-02 MED ORDER — GLYCOPYRROLATE 0.2 MG/ML IJ SOLN
0.2000 mg | INTRAMUSCULAR | Status: DC | PRN
  Filled 2016-06-02: qty 1

## 2016-06-02 MED ORDER — SODIUM CHLORIDE 0.9 % IV SOLN: 250.0000 mL | INTRAVENOUS | Status: DC | PRN

## 2016-06-02 MED ORDER — SODIUM CHLORIDE 0.9% FLUSH
3.0000 mL | Freq: Two times a day (BID) | INTRAVENOUS | Status: DC
  Administered 2016-06-02: 3 mL via INTRAVENOUS

## 2016-06-02 NOTE — Progress Notes (Signed)
[facility]  Received request from [place], CSW for family interest in placement at [facility].  Spoke with family at bedside to confirm interest.  Unfortunately, there is no bed availability today.  Will continue to follow if bed becomes available and patient eligible per hospice MD.  Left contact information with spouse if any needs arise.  Thank you, Hessie Knows RN, BSN [phone]

## 2016-06-02 NOTE — Consult Note (Signed)
Consultation Note Date: 06/02/2016   Patient Name: Richard Lawson  DOB: 02-06-1932  MRN: [id number]  Age / Sex: 82 y.o., male  PCP: Richard Kettle, MD Referring Physician: Charlynne Cousins, MD  Reason for Consultation: Establishing goals of care  HPI/Patient Profile: 82 y.o. male  with past medical history of moderate dementia, blindness, chronic atrial fibrillation on oral anticoagulation  admitted on 05/30/2016 with significant functional decline who has been found to be grossly aspirating.   Clinical Assessment and Goals of Care: I called and spoke with Richard Lawson son on the phone after visiting with the patient yesterday evening. At that time, his son reports that he and his mother agree that his Richard Lawson's coming to the end of his life and they are trying to determine how best to serve his wishes of comfort and dignity.  I met today with Richard Lawson son in conjunction with Richard Lawson.  His son reports that also talked with Richard Lawson and goal moving forward is to focus strictly on interventions for comfort.  We discussed again that his Richard Lawson's grossly aspirating and feeding for comfort.  We also discussed that he has very limited prognosis and I'm in agreement with Richard Lawson and Richard Lawson that his life expectancy is likely in the matter of days to weeks. He reports that his Richard Lawson would not want to be in the hospital but it is not safe for him to return to his home. We talked about options for hospice including residential hospice and his son reports that he and his mother have discussed in agreement that his Richard Lawson would best be served by transitioning to residential hospice for end-of-life care.  SUMMARY OF RECOMMENDATIONS   - Goal moving forward strictly for comfort. - I updated his orders using end-of-life order set. Addition of Haldol and Ativan as needed for agitation and anxiety. - Family would like to  pursue placement at residential hospice for end-of-life care. They expressed preference today for [facility]. I called and discussed with social work and referral placed to confirm with family and make referral as appropriate.  Code Status/Advance Care Planning:  DNR   Symptom Management:   Pain/shortness of breath: Morphine as needed  Anxiety: Ativan as needed  Agitation: Haldol as needed  Excess secretion: Robinul as needed  Palliative Prophylaxis:   Frequent Pain Assessment  Additional Recommendations (Limitations, Scope, Preferences):  Full Comfort Care  Psycho-social/Spiritual:   Desire for further Chaplaincy support:no  Additional Recommendations: Education on Hospice  Prognosis:   < 2 weeks.  Richard Lawson has multiple comorbid conditions and significant decline in his nutrition, functional status, and cognition.  Plan moving forward is for comfort measures only.  He is grossly aspirating and his family is in agreement that his desire would be to continue with food and drink as tolerated for comfort. He is not really taking in meaningful nutrition, and he continues to aspirate on his comfort feeds.  Discharge Planning: Hospice facility      Primary Diagnoses: Present on Admission: .  Elevated troponin . Weakness of both lower limbs . Failure to thrive in adult . Blindness . Dementia . OSA on CPAP . Paroxysmal atrial fibrillation (HCC) . Protein-calorie malnutrition, severe   I have reviewed the medical record, interviewed the patient and family, and examined the patient. The following aspects are pertinent.  Past Medical History:  Diagnosis Date  . Dementia    "not from CVA; unclear if it's Alzheimer's dementia at time" (01/12/2016)  . GERD (gastroesophageal reflux disease)   . History of hiatal hernia   . Lung infection 01/2015  . On home oxygen therapy    w/CPAP  . OSA on CPAP   . Paroxysmal a-fib ([place])   . Retinitis pigmentosa of both eyes   .  Type II diabetes mellitus ([place])   . Unresponsiveness 01/12/2016   "at home this am til he got here to ER; probably lasted ~ 2 hours"    Social History   Social History  . Marital status: Married    Spouse name: N/A  . Number of children: 1  . Years of education: coll   Occupational History  . retired    Social History Main Topics  . Smoking status: Never Smoker  . Smokeless tobacco: Never Used  . Alcohol use No  . Drug use: No  . Sexual activity: Yes   Other Topics Concern  . None   Social History Narrative   Caffeine 2 glasses daily avg.   Family History  Problem Relation Age of Onset  . Lung cancer Richard Lawson   . COPD Richard Lawson    Scheduled Meds: . LORazepam  0.5 mg Intravenous Once  . metoprolol tartrate  12.5 mg Oral BID  . sodium chloride flush  3 mL Intravenous Q12H   Continuous Infusions:  PRN Meds:.sodium chloride, acetaminophen **OR** acetaminophen, albuterol, antiseptic oral rinse, glycopyrrolate **OR** glycopyrrolate **OR** glycopyrrolate, haloperidol **OR** haloperidol **OR** haloperidol lactate, LORazepam, morphine injection, ondansetron **OR** ondansetron (ZOFRAN) IV, polyvinyl alcohol, sodium chloride flush Medications Prior to Admission:  Prior to Admission medications   Medication Sig Start Date End Date Taking? Authorizing Provider  acetaminophen (TYLENOL) 500 MG tablet Take 500 mg by mouth every 4 (four) hours as needed for moderate pain, fever or headache.   Yes Historical Provider, MD  alum & mag hydroxide-simeth (MAALOX PLUS) 400-400-40 MG/5ML suspension Take 15 mLs by mouth every 6 (six) hours as needed for indigestion.   Yes Historical Provider, MD  edoxaban (SAVAYSA) 60 MG TABS tablet Take 60 mg by mouth daily. Reported on 03/15/2016   Yes Historical Provider, MD  guaifenesin (ROBITUSSIN) 100 MG/5ML syrup Take 200 mg by mouth 3 (three) times daily as needed for cough.   Yes Historical Provider, MD  loperamide (IMODIUM) 2 MG capsule Take 2 mg by mouth as  needed for diarrhea or loose stools (no more than 8 doses per 24 hours per [facility]).   Yes Historical Provider, MD  magnesium hydroxide (MILK OF MAGNESIA) 400 MG/5ML suspension Take 30 mLs by mouth at bedtime as needed for mild constipation.   Yes Historical Provider, MD  memantine (NAMENDA) 10 MG tablet Take 0.5 tab at bedtime for 7 days, then 0.5 tab twice daily for 7 days, then 1 tab twice daily Patient taking differently: Take 10 mg by mouth 2 (two) times daily.  03/20/16  Yes Adam Telford Nab, DO  metoprolol tartrate (LOPRESSOR) 25 MG tablet Take 0.5 tablets (12.5 mg total) by mouth 2 (two) times daily. 01/16/16  Yes Reyne Dumas, MD  neomycin-bacitracin-polymyxin (  NEOSPORIN) [phone] ointment Apply 1 application topically 4 (four) times daily as needed (wound care).   Yes Historical Provider, MD  risperiDONE (RISPERDAL) 1 MG/ML oral solution Take 2 mg by mouth at bedtime.    Yes Historical Provider, MD  risperiDONE (RISPERDAL) 1 MG/ML oral solution Take 1 mg by mouth daily as needed (every morning as needed for agitation).   Yes Historical Provider, MD  traZODone (DESYREL) 50 MG tablet Take 50 mg by mouth at bedtime.  02/27/16  Yes Historical Provider, MD  vitamin B-12 (CYANOCOBALAMIN) 1000 MCG tablet Take 1,000 mcg by mouth daily.   Yes Historical Provider, MD   Allergies  Allergen Reactions  . Metformin Other (See Comments)    Altered Mental Status  . Aricept [Donepezil] Other (See Comments)    hypotension   Review of Systems  Unable to perform ROS He is able to nod "no" when asked about pain, SOB, anxiety, or nausea.  Physical Exam General: Alert, awake, in no acute distress. Sleepy and not able to participate in conversation but will answer a few yes and no questions with nodding in 1 word answers Heart: Regular rate and rhythm. Low-grade murmur appreciated. Lungs: Good air movement, clear Abdomen: Soft, nontender, nondistended, positive bowel sounds.  Ext: No significant edema Skin:  Warm and dry Neuro: Does not participate in formal exam  Vital Signs: BP 94/69 (BP Location: Left Arm)   Pulse (!) 114   Temp 99.8 F (37.7 C) (Axillary)   Resp 20   Ht 6' (1.829 m)   Wt 70.7 kg (155 lb 12.8 oz)   SpO2 90%   BMI 21.13 kg/m  Pain Assessment: No/denies pain   Pain Score: Asleep   SpO2: SpO2: 90 % O2 Device:SpO2: 90 % O2 Flow Rate: .O2 Flow Rate (L/min): 2 L/min  IO: Intake/output summary:  Intake/Output Summary (Last 24 hours) at 06/02/16 1057 Last data filed at 06/02/16 0700  Gross per 24 hour  Intake             2720 ml  Output                0 ml  Net             2720 ml    LBM: Last BM Date:  (PTA) Baseline Weight: Weight: 70.7 kg (155 lb 12.8 oz) Most recent weight: Weight: 70.7 kg (155 lb 12.8 oz)     Palliative Assessment/Data: 20%   Flowsheet Rows   Flowsheet Row Most Recent Value  Intake Tab  Referral Department  Hospitalist  Unit at Time of Referral  Cardiac/Telemetry Unit  Palliative Care Primary Diagnosis  Cardiac  Date Notified  05/31/16  Palliative Care Type  New Palliative care  Reason for referral  Clarify Goals of Care  Date of Admission  05/30/16  Date first seen by Palliative Care  06/01/16  # of days Palliative referral response time  1 Day(s)  # of days IP prior to Palliative referral  1  Clinical Assessment  Palliative Performance Scale Score  20%  Pain Max last 24 hours  Not able to report  Pain Min Last 24 hours  Not able to report  Psychosocial & Spiritual Assessment  Palliative Care Outcomes  Patient/Family meeting held?  Yes  Who was at the meeting?  Patient son  Palliative Care Outcomes  Clarified goals of care, Transitioned to hospice, Changed to focus on comfort  Palliative Care follow-up planned  Yes, Facility  Time In: 1010 Time Out: 1105 Time Total: 55 Greater than 50%  of this time was spent counseling and coordinating care related to the above assessment and plan.  Signed by: Micheline Rough, MD    Please contact Palliative Medicine Team phone at [phone] for questions and concerns.  For individual provider: See Shea Evans

## 2016-06-02 NOTE — Progress Notes (Addendum)
[facility] PROGRESS NOTE    Progress Note  Richard Lawson  ZOX:[id number] DOB: 1932/07/26 DOA: 05/30/2016 PCP:  [name], MD     Brief Narrative:   Richard Lawson is an 82 y.o. male past medical history of moderate dementia, blindness, chronic atrial fibrillation on oral anticoagulation who presents to the ED complaining of lower extremity weakness. He had been living at an assisted living facility for several weeks as the family was concerned about the wife's health, most of the history was obtained from the wife.  Assessment/Plan:   Weakness of both lower limbs: I appreciate cardiology's assistance, we had a long discussion with family and they agree to move towards comfort care. They would not like any further escalation of care and they will elect to medication and make him comfortable. They would like to make him comfortable and minimize medication labs and intervention. Due to his ongoing aspiration is morphine for shortness of breath.  Aspiration pneumonitis: Severe speech evaluation determined that he was grossly aspirating. He is in respiratory distress will use morphine for respiratory distress. His life expectancy is less than 2 week, I have expressed this and have a long discussion with the family due to his gross aspiration.  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    DVT prophylaxis: On oral anticoagulant Family Communication: Code confirmed by wife.  Disposition Plan/Barrier to D/C: [facility] Code Status:     Code Status Orders        Start     Ordered   05/30/16 1843  Full code   Continuous     05/30/16 1842    Code Status History      Date Active Date Inactive Code Status Order ID Comments User Context   01/12/2016  8:12 PM 01/16/2016  6:35 PM Full Code [id number]  Russella Dar, NP Inpatient   01/21/2015 12:11 AM 01/24/2015  3:38 PM Full Code [id number]  Therisa Doyne, MD Inpatient    Advance Directive Documentation        Most Recent Value   Type of Advance Directive  Healthcare Power of Attorney, Living will   Pre-existing out of facility DNR order (yellow form or pink MOST form)     "MOST" Form in Place?          IV Access:    Peripheral IV   Procedures and diagnostic studies:   No results found.   Medical Consultants:    None.  Anti-Infectives:   none  Subjective:    Alease Medina he has no complains.  Objective:    Vitals:   06/01/16 0646 06/01/16 1500 06/01/16 2140 06/02/16 0500  BP: 139/70 (!) 157/89 (!) 118/104 94/69  Pulse: (!) 108 (!) 105 (!) 107 (!) 114  Resp: 18 20 18 20   Temp: 99.9 F (37.7 C)   99.8 F (37.7 C)  TempSrc: Axillary   Axillary  SpO2: 92% 93% 91% 90%  Weight:      Height:        Intake/Output Summary (Last 24 hours) at 06/02/16 1007 Last data filed at 06/02/16 0700  Gross per 24 hour  Intake             2720 ml  Output  0 ml  Net             2720 ml   Filed Weights   05/30/16 2035  Weight: 70.7 kg (155 lb 12.8 oz)    Exam: General exam: In no acute distress. Respiratory system: Good air movement and clear to auscultation. Cardiovascular system: S1 & S2 heard, RRR.  Gastrointestinal system: Abdomen is nondistended, soft and nontender.  Central nervous system: Alert and oriented. No focal neurological deficits. Extremities: No pedal edema. Skin: No rashes, lesions or ulcers    Data Reviewed:    Labs: Basic Metabolic Panel:  Recent Labs Lab 05/30/16 1416 05/31/16 0058 06/02/16 0518  NA 141 141 136  K 3.4* 3.2* 4.7  CL 103 108 107  CO2 29 24 21*  GLUCOSE 152* 138* 127*  BUN 14 13 11   CREATININE 0.63 0.66 0.61   CALCIUM 8.8* 8.3* 8.4*   GFR Estimated Creatinine Clearance: 71.2 mL/min (by C-G formula based on SCr of 0.8 mg/dL). Liver Function Tests:  Recent Labs Lab 05/30/16 1416  AST 53*  ALT 40  ALKPHOS 87  BILITOT 1.3*  PROT 6.6  ALBUMIN 3.6   No results for input(s): LIPASE, AMYLASE in the last 168 hours. No results for input(s): AMMONIA in the last 168 hours. Coagulation profile No results for input(s): INR, PROTIME in the last 168 hours.  CBC:  Recent Labs Lab 05/30/16 1416  WBC 13.0*  NEUTROABS 9.9*  HGB 13.9  HCT 43.0  MCV 92.3  PLT 294   Cardiac Enzymes:  Recent Labs Lab 05/30/16 1416 05/30/16 2035 05/31/16 0058 05/31/16 0621  TROPONINI 0.03* 0.03* <0.03 0.03*   BNP (last 3 results) No results for input(s): PROBNP in the last 8760 hours. CBG:  Recent Labs Lab 06/01/16 0753 06/01/16 1326 06/01/16 1747 06/01/16 2139 06/02/16 0746  GLUCAP 119* 119* 119* 105* 133*   D-Dimer: No results for input(s): DDIMER in the last 72 hours. Hgb A1c: No results for input(s): HGBA1C in the last 72 hours. Lipid Profile: No results for input(s): CHOL, HDL, LDLCALC, TRIG, CHOLHDL, LDLDIRECT in the last 72 hours. Thyroid function studies: No results for input(s): TSH, T4TOTAL, T3FREE, THYROIDAB in the last 72 hours.  Invalid input(s): FREET3 Anemia work up: No results for input(s): VITAMINB12, FOLATE, FERRITIN, TIBC, IRON, RETICCTPCT in the last 72 hours. Sepsis Labs:  Recent Labs Lab 05/30/16 1416  WBC 13.0*   Microbiology Recent Results (from the past 240 hour(s))  Urine culture     Status: None   Collection Time: 05/30/16  2:34 PM  Result Value Ref Range Status   Specimen Description URINE, CATHETERIZED  Final   Special Requests NONE  Final   Culture NO GROWTH Performed at [facility]   Final   Report Status 05/31/2016 FINAL  Final  MRSA PCR Screening     Status: None   Collection Time: 05/30/16  8:49 PM  Result Value Ref Range Status    MRSA by PCR NEGATIVE NEGATIVE Final    Comment:        The GeneXpert MRSA Assay (FDA approved for NASAL specimens only), is one component of a comprehensive MRSA colonization surveillance program. It is not intended to diagnose MRSA infection nor to guide or monitor treatment for MRSA infections.      Medications:   . edoxaban  60 mg Oral Daily  . feeding supplement (ENSURE ENLIVE)  237 mL Oral BID BM  . memantine  10 mg Oral BID  . metoprolol tartrate  12.5 mg Oral BID  . risperiDONE  2 mg Oral QHS  . sodium chloride flush  3 mL Intravenous Q12H  . traZODone  50 mg Oral QHS  . vitamin B-12  1,000 mcg Oral Daily   Continuous Infusions: . 0.9 % NaCl with KCl 40 mEq / L 75 mL/hr (06/02/16 0700)    Time spent: 25 min   LOS: 2 days   Marinda Elk  [facility] Pager [phone]  *Please refer to [email or web address], password [email or web address] to get updated schedule on who will round on this patient, as hospitalists switch teams weekly. If 7PM-7AM, please contact night-coverage at [email or web address], password [email or web address] for any overnight needs.  06/02/2016, 10:07 AM

## 2016-06-02 NOTE — Clinical Social Work Note (Signed)
CSW spoke with palliative doctor who reported that patient's family would like [facility] for residential hospice placement.  CSW called and spoke with Stacy of [facility] who stated that she would follow up with family today and hopefully have a bed for patient tomorrow.  Elray Buba, LCSW [facility] Clinical Social Worker - Weekend Coverage cell #: [phone]

## 2016-06-02 NOTE — Consult Note (Signed)
CARDIOLOGY CONSULT NOTE  Patient ID: Richard Lawson MRN: [id number] DOB/AGE: 1932/03/31 83 y.o.  Admit date: 05/30/2016 Referring Physician  Lambert Keto, MD Primary Physician:   [name], MD Reason for Consultation  Plans on long-term care and cardiac management of sick sinus syndrome  HPI: Richard Lawson  is a 82 y.o. male  With Multiple medical comorbidities, history of dementia, paroxysmal atrial fibrillation, blindness in both eyes due to retinitis pigmentosa, diabetes mellitus, was admitted to the hospital with failure to thrive and generalized weakness. He was also found to be at risk for aspiration pneumonia, failure to thrive in adult. As patient was not improving, as I know the family well and he has been my patient from cardiac standpoint, I was asked to intervene. Patient's family also requested that I see the patient.  Past Medical History:  Diagnosis Date  . Dementia    "not from CVA; unclear if it's Alzheimer's dementia at time" (01/12/2016)  . GERD (gastroesophageal reflux disease)   . History of hiatal hernia   . Lung infection 01/2015  . On home oxygen therapy    w/CPAP  . OSA on CPAP   . Paroxysmal a-fib (HCC)   . Retinitis pigmentosa of both eyes   . Type II diabetes mellitus (HCC)   . Unresponsiveness 01/12/2016   "at home this am til he got here to ER; probably lasted ~ 2 hours"      Past Surgical History:  Procedure Laterality Date  . CATARACT EXTRACTION     "don't remember which side"  . FRACTURE SURGERY    . ORIF RADIAL HEAD / NECK FRACTURE Right 10/2009  . TRANSURETHRAL RESECTION OF PROSTATE  02/2010     Family History  Problem Relation Age of Onset  . Lung cancer Father   . COPD Brother      Social History: Social History   Social History  . Marital status: Married    Spouse name: N/A  . Number of children: 1  . Years of education: coll   Occupational History  . retired    Social History Main Topics  . Smoking status: Never Smoker  .  Smokeless tobacco: Never Used  . Alcohol use No  . Drug use: No  . Sexual activity: Yes   Other Topics Concern  . Not on file   Social History Narrative   Caffeine 2 glasses daily avg.     Prescriptions Prior to Admission  Medication Sig Dispense Refill Last Dose  . acetaminophen (TYLENOL) 500 MG tablet Take 500 mg by mouth every 4 (four) hours as needed for moderate pain, fever or headache.   unknown  . alum & mag hydroxide-simeth (MAALOX PLUS) 400-400-40 MG/5ML suspension Take 15 mLs by mouth every 6 (six) hours as needed for indigestion.   unknown  . edoxaban (SAVAYSA) 60 MG TABS tablet Take 60 mg by mouth daily. Reported on 03/15/2016   05/30/2016 at 0900  . guaifenesin (ROBITUSSIN) 100 MG/5ML syrup Take 200 mg by mouth 3 (three) times daily as needed for cough.   unknown  . loperamide (IMODIUM) 2 MG capsule Take 2 mg by mouth as needed for diarrhea or loose stools (no more than 8 doses per 24 hours per [facility]).   unknown  . magnesium hydroxide (MILK OF MAGNESIA) 400 MG/5ML suspension Take 30 mLs by mouth at bedtime as needed for mild constipation.   unknown  . memantine (NAMENDA) 10 MG tablet Take 0.5 tab at bedtime for 7 days, then  0.5 tab twice daily for 7 days, then 1 tab twice daily (Patient taking differently: Take 10 mg by mouth 2 (two) times daily. ) 60 tablet 0 05/30/2016 at Unknown time  . metoprolol tartrate (LOPRESSOR) 25 MG tablet Take 0.5 tablets (12.5 mg total) by mouth 2 (two) times daily. 60 tablet 0 05/30/2016 at 0900  . neomycin-bacitracin-polymyxin (NEOSPORIN) [phone] ointment Apply 1 application topically 4 (four) times daily as needed (wound care).   unknown  . risperiDONE (RISPERDAL) 1 MG/ML oral solution Take 2 mg by mouth at bedtime.    05/29/2016 at Unknown time  . risperiDONE (RISPERDAL) 1 MG/ML oral solution Take 1 mg by mouth daily as needed (every morning as needed for agitation).   unknown  . traZODone (DESYREL) 50 MG tablet Take 50 mg by mouth at bedtime.   2  05/29/2016 at Unknown time  . vitamin B-12 (CYANOCOBALAMIN) 1000 MCG tablet Take 1,000 mcg by mouth daily.   05/30/2016 at 1200     ROS: Unable to be obtained due to patient's condition.    Physical Exam: Blood pressure 94/69, pulse (!) 114, temperature 99.8 F (37.7 C), temperature source Axillary, resp. rate 20, height 6' (1.829 m), weight 70.7 kg (155 lb 12.8 oz), SpO2 90 %.   General appearance: alert, mild distress, slowed mentation and tachypneic Lungs: clear to auscultation bilaterally Heart: S1, S2 normal and systolic murmur: early systolic 2/6, crescendo at apex Abdomen: soft, non-tender; bowel sounds normal; no masses,  no organomegaly Extremities: extremities normal, atraumatic, no cyanosis or edema Neurologic: Patient is easily arousable, answers simple yes or no, has difficulty in completing his words and his words fade off. No obvious deficits.  Labs:   Lab Results  Component Value Date   WBC 13.0 (H) 05/30/2016   HGB 13.9 05/30/2016   HCT 43.0 05/30/2016   MCV 92.3 05/30/2016   PLT 294 05/30/2016    Recent Labs Lab 05/30/16 1416  06/02/16 0518  NA 141  < > 136  K 3.4*  < > 4.7  CL 103  < > 107  CO2 29  < > 21*  BUN 14  < > 11  CREATININE 0.63  < > 0.61  CALCIUM 8.8*  < > 8.4*  PROT 6.6  --   --   BILITOT 1.3*  --   --   ALKPHOS 87  --   --   ALT 40  --   --   AST 53*  --   --   GLUCOSE 152*  < > 127*  < > = values in this interval not displayed. HEMOGLOBIN A1C Lab Results  Component Value Date   HGBA1C 6.9 (H) 01/12/2016   MPG 151 01/12/2016   Recent Labs  01/12/16 2114 03/20/16 1554  TSH 3.222 2.59    EKG: Normal sinus rhythm at rate of 90 bpm, left axis deviation. No evidence of ischemia. Echocardiogram 01/14/2016: Normal LV systolic function, grade 1 diastolic dysfunction. Scheduled Meds:  . LORazepam  0.5 mg Intravenous Once  . metoprolol tartrate  12.5 mg Oral BID   Continuous Infusions:  PRN Meds:.albuterol, morphine injection,  morphine injection  ASSESSMENT AND PLAN:  1. Failure to thrive in an adult with fairly advanced multiple medical comorbidities including but not limited to dementia, old stroke, risk of aspiration, complete blindness, para suite a fibrillation now admitted with above complaints. I had a long discussion with the patient's wife and also his son, patient essentially doing poorly with regard to recuperation and  is at risk for aspiration, inability to feed him orally, patient gradually becoming confused and also unable to mobilize and fatigued. I agree with making the patient comfort care, patient's wife and also son who have power of attorney for health agree after discussions that no further aggressive attempts at managing his medical issues will be performed, measures for comfort will be initiated.  I also discussed with Dr. Lambert Keto, who is taking care of the patient and he is in agreement. Call if questions.  Yates Decamp, MD 06/02/2016, 10:15 AM [facility] Pager: [phone] Office: [phone] If no answer Cell [phone]

## 2016-06-03 DIAGNOSIS — J690 Pneumonitis due to inhalation of food and vomit: Principal | ICD-10-CM

## 2016-06-03 LAB — GLUCOSE, CAPILLARY: Glucose-Capillary: 166 mg/dL — ABNORMAL HIGH (ref 65–99)

## 2016-06-03 MED ORDER — LORAZEPAM 2 MG/ML PO CONC: 1.0000 mg | Freq: Four times a day (QID) | ORAL | 0 refills | Status: AC | PRN

## 2016-06-03 MED ORDER — MORPHINE SULFATE (CONCENTRATE) 10 MG/0.5ML PO SOLN: 20.0000 mg | ORAL | Status: AC | PRN

## 2016-06-03 NOTE — Progress Notes (Signed)
Patient is set to discharge to [facility] today. Patient & son at bedside aware. Discharge packet given to RN, Delice Bison. [facility] called for transport.     Lincoln Maxin, LCSW [facility] Clinical Social Worker cell #: [phone]

## 2016-06-03 NOTE — Discharge Summary (Signed)
Physician Discharge Summary  Richard Lawson:[id number] DOB: November 08, 1932 DOA: 05/30/2016  PCP:  [name], MD  Admit date: 05/30/2016 Discharge date: 06/03/2016  Time spent: 35 minutes  Recommendations for Outpatient Follow-up:  1. The patient will discharge to Residential hospice facility   Discharge Diagnoses:  Principal Problem:   Weakness of both lower limbs Active Problems:   Paroxysmal atrial fibrillation (HCC)   DM w/o complication type II (HCC)   OSA on CPAP   Blindness   Dementia   Protein-calorie malnutrition, severe   Elevated troponin   Failure to thrive in adult   Retinitis pigmentosa of both eyes   Discharge Condition:Stable  Diet recommendation: comfort feeds  Filed Weights   05/30/16 2035  Weight: 70.7 kg (155 lb 12.8 oz)    History of present illness:  82 year old with past medical history of moderate dementia with atrial fibrillation on anticoagulation the comes into the hospital Generalized weakness.  Hospital Course:  Generalized weakness/failure to thrive: His cardiologist was consulted Dr. Mendel Corning with this course with the family, his poor prognosis. Having long discussion with the family they decided to move towards comfort care due to his ongoing severe aspiration.  Aspiration pneumonitis: Therapy was consulted who determined he was grossly aspirating. He was started on morphine for comfort, his life expectancy is less than 2 weeks.  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: Dementia with behavioral disturbances: Protein-calorie malnutrition, severe Elevated troponin Failure to thrive in adult    Procedures:CXR  Consultations:  PMT  CArdiology  Discharge Exam: Vitals:   06/02/16 2216 06/03/16 0319  BP: 117/71 121/65  Pulse: (!) 119 (!) 111  Resp: 20   Temp: 100.2 F (37.9 C) (!) 101.1 F (38.4 C)     General: A&O x3 Cardiovascular: RRR Respiratory: good air movement CTA B/L  Discharge Instructions    Current Discharge Medication List    START taking these medications   Details  LORazepam (ATIVAN) 2 MG/ML concentrated solution Take 0.5 mLs (1 mg total) by mouth every 6 (six) hours as needed for anxiety (agitation). Qty: 30 mL, Refills: 0      STOP taking these medications     acetaminophen (TYLENOL) 500 MG tablet      alum & mag hydroxide-simeth (MAALOX PLUS) 400-400-40 MG/5ML suspension      edoxaban (SAVAYSA) 60 MG TABS tablet      guaifenesin (ROBITUSSIN) 100 MG/5ML syrup      loperamide (IMODIUM) 2 MG capsule      magnesium hydroxide (MILK OF MAGNESIA) 400 MG/5ML suspension      memantine (NAMENDA) 10 MG tablet      metoprolol tartrate (LOPRESSOR) 25 MG tablet      neomycin-bacitracin-polymyxin (NEOSPORIN) [phone] ointment      risperiDONE (RISPERDAL) 1 MG/ML oral solution      risperiDONE (RISPERDAL) 1 MG/ML oral solution      traZODone (DESYREL) 50 MG tablet      vitamin B-12 (CYANOCOBALAMIN) 1000 MCG tablet        Allergies  Allergen Reactions  . Metformin Other (See Comments)    Altered Mental Status  . Aricept [Donepezil] Other (See Comments)    hypotension      The results of significant diagnostics from this hospitalization (including imaging, microbiology, ancillary and laboratory) are listed below for reference.    Significant Diagnostic Studies: Dg Chest 2 View  Result Date: 05/30/2016 CLINICAL DATA:  Shortness of Breath EXAM: CHEST  2 VIEW COMPARISON:  03/11/2016 FINDINGS: Cardiomediastinal silhouette is stable. Elevation of the right hemidiaphragm again noted. No infiltrate or pulmonary edema. Moderate size hiatal hernia again noted. Atherosclerotic calcifications of thoracic aorta. Degenerative changes bilateral AC joints. IMPRESSION: Cardiomegaly. Chronic elevation of the right hemidiaphragm. Moderate size hiatal hernia. No  active disease. Electronically Signed   By: Natasha Mead M.D.   On: 05/30/2016 15:37   Ct Head Wo Contrast  Result Date: 05/30/2016 CLINICAL DATA:  The patient has recently become unable to stand or walk. EXAM: CT HEAD WITHOUT CONTRAST TECHNIQUE: Contiguous axial images were obtained from the base of the skull through the vertex without intravenous contrast. COMPARISON:  Head CT scan 02/14/2016.  Brain MRI 01/24/2016. FINDINGS: Cortical atrophy is identified and not notably changed. No evidence of acute intracranial abnormality including hemorrhage, infarct, mass lesion, mass effect, midline shift or abnormal extra-axial fluid collection. No hydrocephalus or pneumocephalus. The calvarium is intact. Imaged paranasal sinuses and mastoid air cells are clear. IMPRESSION: No acute abnormality. Cortical atrophy is unchanged in appearance. Electronically Signed   By: Drusilla Kanner M.D.   On: 05/30/2016 15:34   Dg Knee Complete 4 Views Left  Result Date: 05/30/2016 CLINICAL DATA:  Altered mental status EXAM: LEFT KNEE - COMPLETE 4+ VIEW COMPARISON:  None. FINDINGS: Four views of the left knee submitted. No acute fracture or subluxation. Mild spurring of medial tibial plateau. Minimal narrowing of patellofemoral joint space. No joint effusion. IMPRESSION: No acute fracture or subluxation.  Mild degenerative changes. Electronically Signed   By: Natasha Mead M.D.   On: 05/30/2016 15:35   Dg Knee Complete 4 Views Right  Result Date: 05/30/2016 CLINICAL DATA:  The patient is unable to stand, a recent development. Altered mental status. EXAM: RIGHT KNEE - COMPLETE 4+ VIEW COMPARISON:  Plain films of the right knee 10/31/2009. FINDINGS: No acute bony or joint abnormality is identified. Degenerative change about the knee is worst in the medial compartment has progressed somewhat since the prior examination. Small ossification off the medial epicondyle of the femur is again seen and consistent with old MCL injury. No  joint effusion. IMPRESSION: No acute abnormality. Some progression of osteoarthritis about the knee which appears worst in the medial compartment. Electronically Signed   By: Drusilla Kanner M.D.   On: 05/30/2016 15:36    Microbiology: Recent Results (from the past 240 hour(s))  Urine culture     Status: None   Collection Time: 05/30/16  2:34 PM  Result Value Ref Range Status   Specimen Description URINE, CATHETERIZED  Final   Special Requests NONE  Final   Culture NO GROWTH Performed at [facility]   Final   Report Status 05/31/2016 FINAL  Final  MRSA PCR Screening     Status: None   Collection Time: 05/30/16  8:49 PM  Result Value Ref Range Status   MRSA by PCR NEGATIVE NEGATIVE Final    Comment:        The GeneXpert MRSA Assay (FDA approved for NASAL specimens only), is one component of a comprehensive MRSA colonization surveillance program. It is not intended to diagnose MRSA infection nor to guide or monitor treatment for MRSA infections.      Labs: Basic Metabolic Panel:  Recent Labs Lab 05/30/16 1416 05/31/16 0058 06/02/16 0518  NA 141 141 136  K 3.4* 3.2* 4.7  CL 103 108 107  CO2 29 24 21*  GLUCOSE 152* 138* 127*  BUN  14 13 11   CREATININE 0.63 0.66 0.61  CALCIUM 8.8* 8.3* 8.4*   Liver Function Tests:  Recent Labs Lab 05/30/16 1416  AST 53*  ALT 40  ALKPHOS 87  BILITOT 1.3*  PROT 6.6  ALBUMIN 3.6   No results for input(s): LIPASE, AMYLASE in the last 168 hours. No results for input(s): AMMONIA in the last 168 hours. CBC:  Recent Labs Lab 05/30/16 1416  WBC 13.0*  NEUTROABS 9.9*  HGB 13.9  HCT 43.0  MCV 92.3  PLT 294   Cardiac Enzymes:  Recent Labs Lab 05/30/16 1416 05/30/16 2035 05/31/16 0058 05/31/16 0621  TROPONINI 0.03* 0.03* <0.03 0.03*   BNP: BNP (last 3 results) No results for input(s): BNP in the last 8760 hours.  ProBNP (last 3 results) No results for input(s): PROBNP in the last 8760  hours.  CBG:  Recent Labs Lab 06/01/16 1747 06/01/16 2139 06/02/16 0746 06/02/16 2213 06/03/16 0736  GLUCAP 119* 105* 133* 162* 166*       Signed:  Marinda Elk MD.  [facility] 06/03/2016, 10:31 AM

## 2016-06-03 NOTE — Progress Notes (Signed)
Pt discharged via [facility], report called to [facility]. Richard Lawson

## 2016-06-11 DEATH — deceased

## 2016-07-24 ENCOUNTER — Ambulatory Visit: Payer: Medicare Other | Admitting: Neurology

## 2016-07-28 IMAGING — CT CT HEAD W/O CM
3 of 4 series · 15 of 47 positions shown, 18 images · non-contrast
Comparison: Head CT scan 02/14/2016.  Brain MRI 01/24/2016.

CLINICAL DATA: The patient has recently become unable to stand or
walk.

EXAM:
CT HEAD WITHOUT CONTRAST
TECHNIQUE: Contiguous axial images were obtained from the base of the skull
through the vertex without intravenous contrast.

[Series 2: head w/o · axial · non-contrast · 0.43mm/px · z∈[-49,+106]mm · 9 of 37 slices shown, 12 images]
[im 3/37  brain]
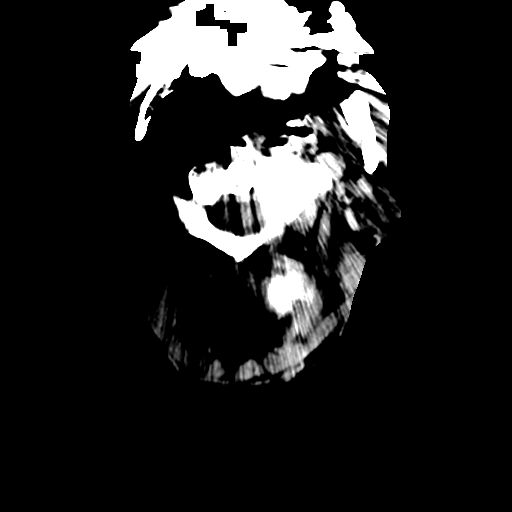
[im 3/37  bone]
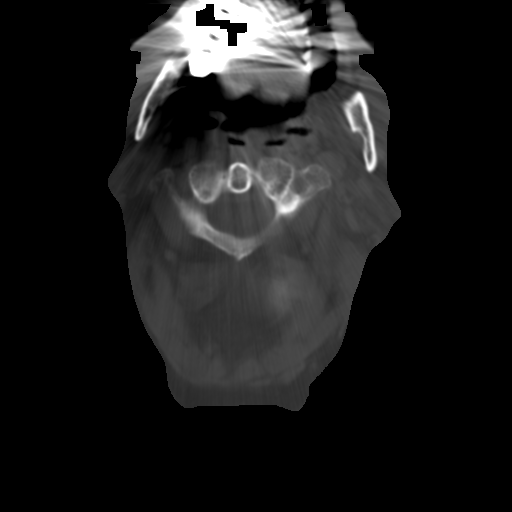
[im 8/37  brain]
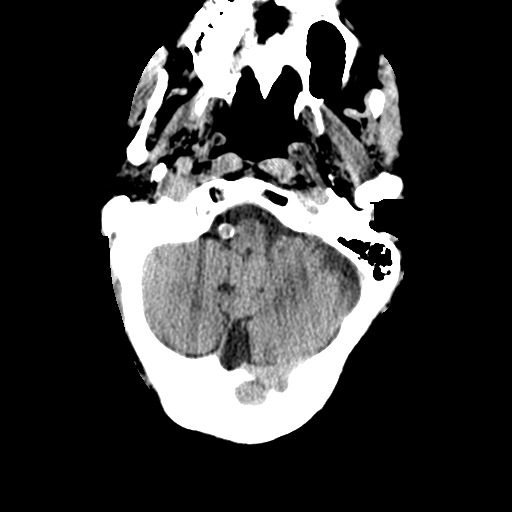
[im 11/37  brain]
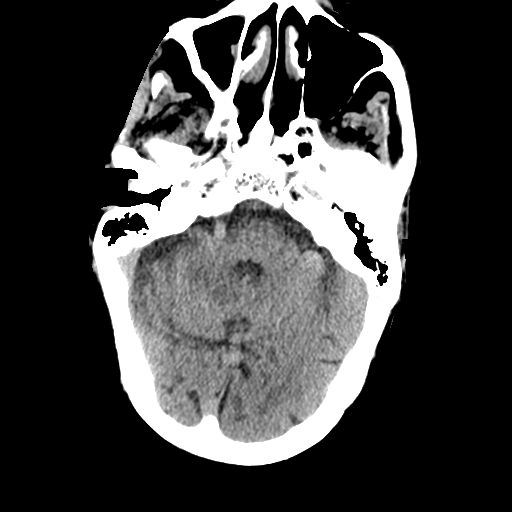
[im 16/37  brain]
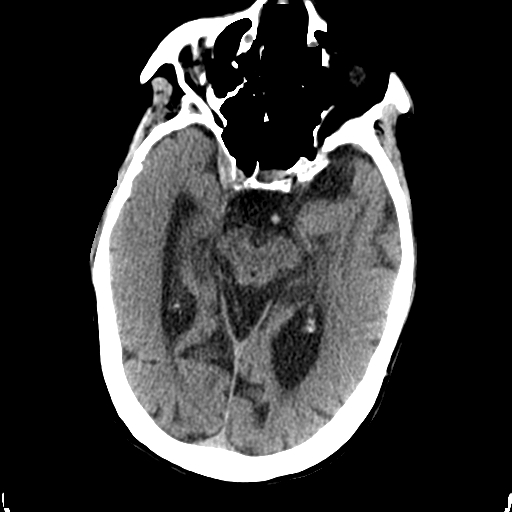
[im 19/37  brain]
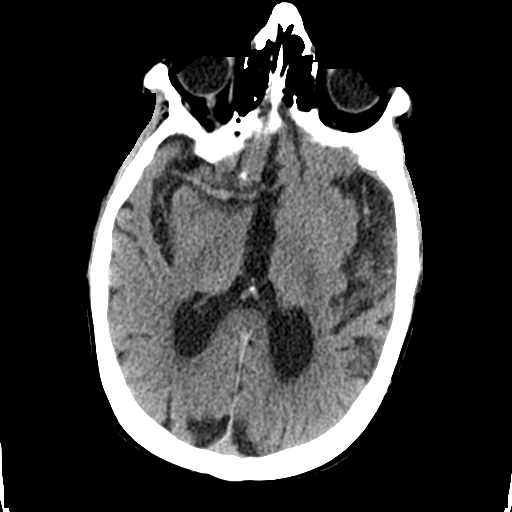
[im 19/37  bone]
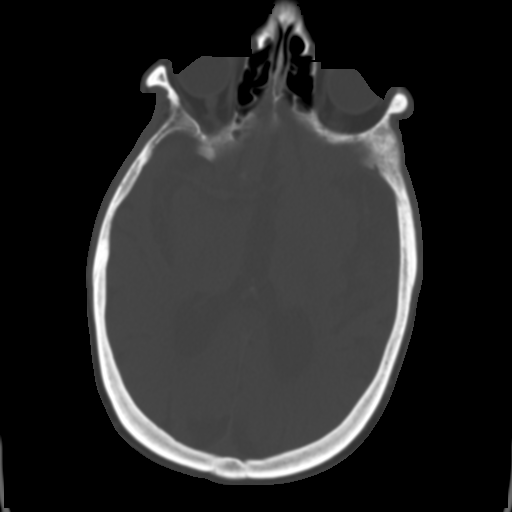
[im 21/37  brain]
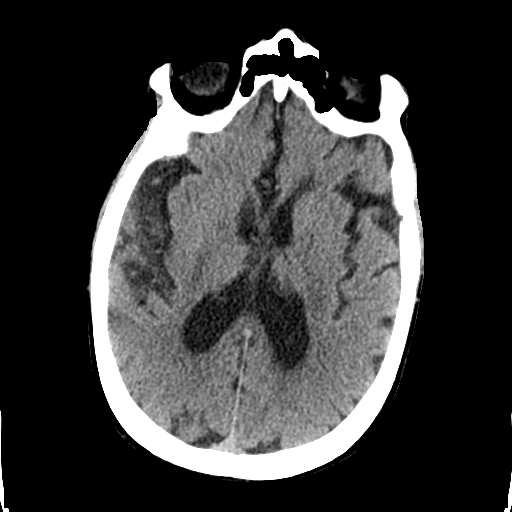
[im 26/37  brain]
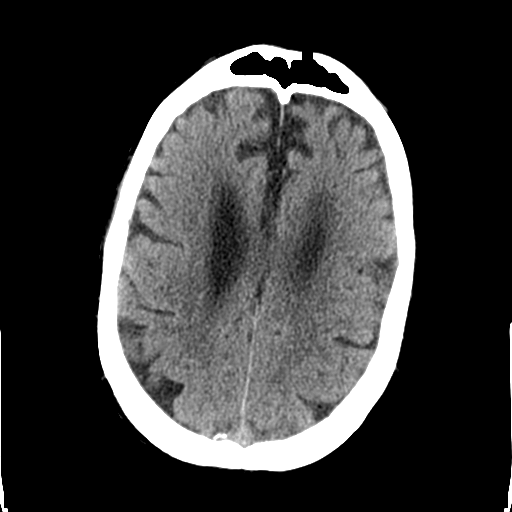
[im 29/37  brain]
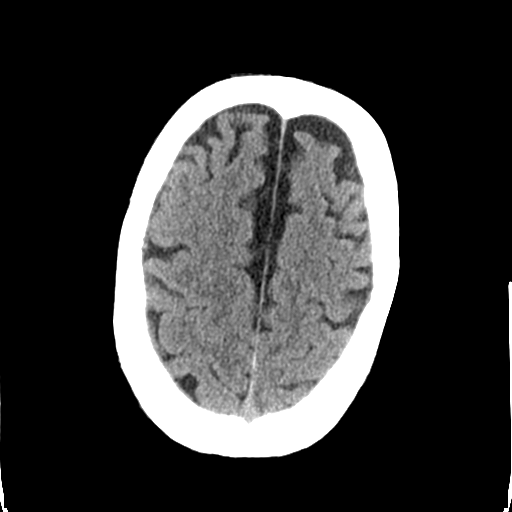
[im 34/37  brain]
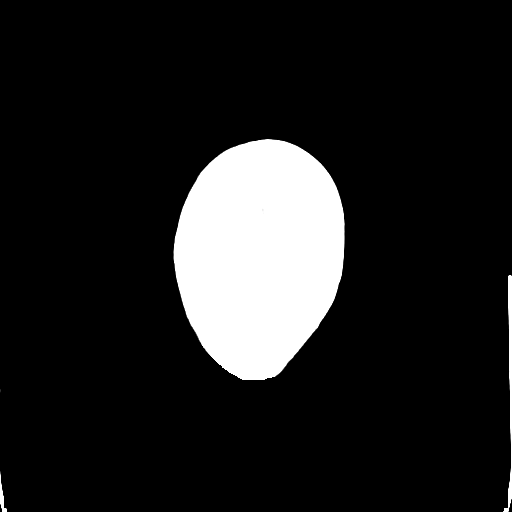
[im 34/37  bone]
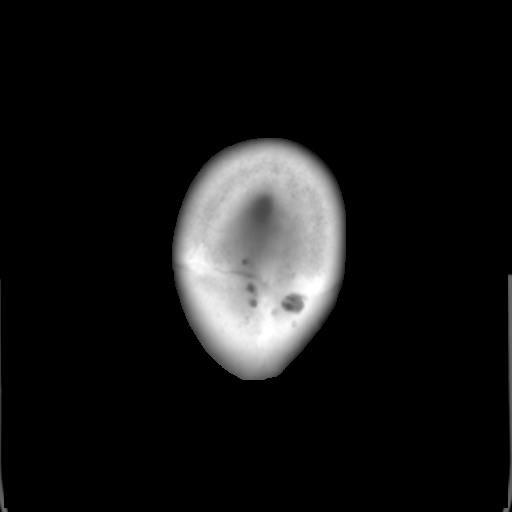

[Series 4: coronal · coronal · 0.32mm/px · 3 of 76 slices shown]
[im 26/76  brain]
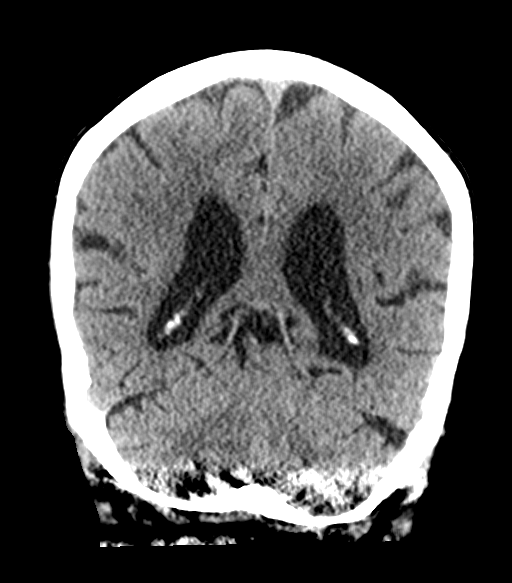
[im 34/76  brain]
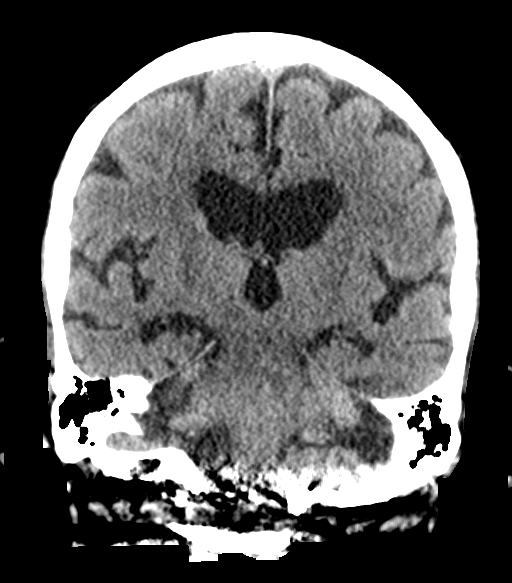
[im 42/76  brain]
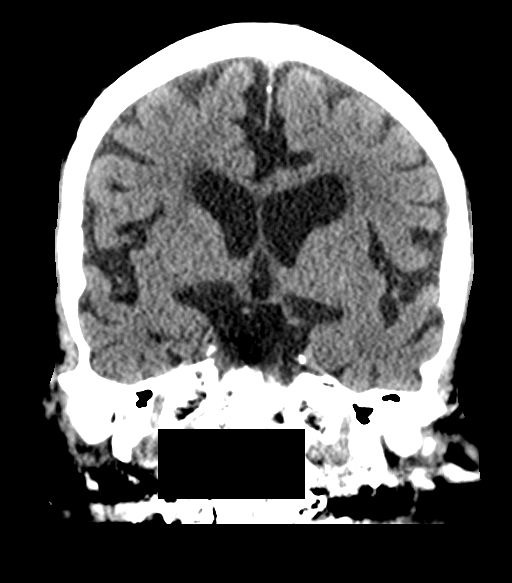

[Series 5: sagittal · sagittal · 0.36mm/px · 3 of 55 slices shown]
[im 19/55  brain]
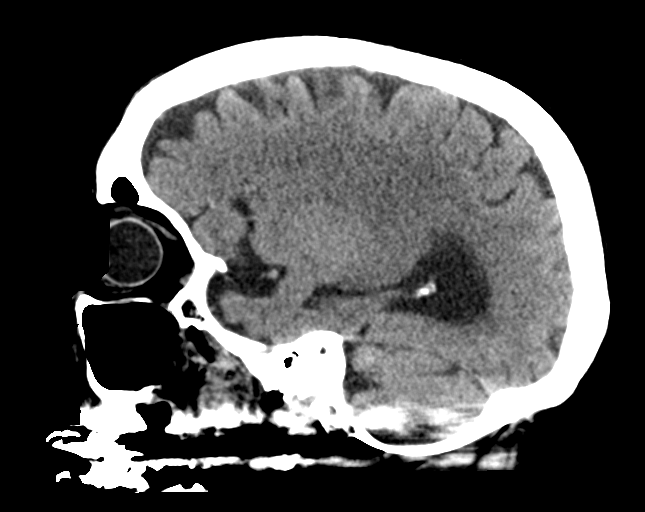
[im 28/55  brain]
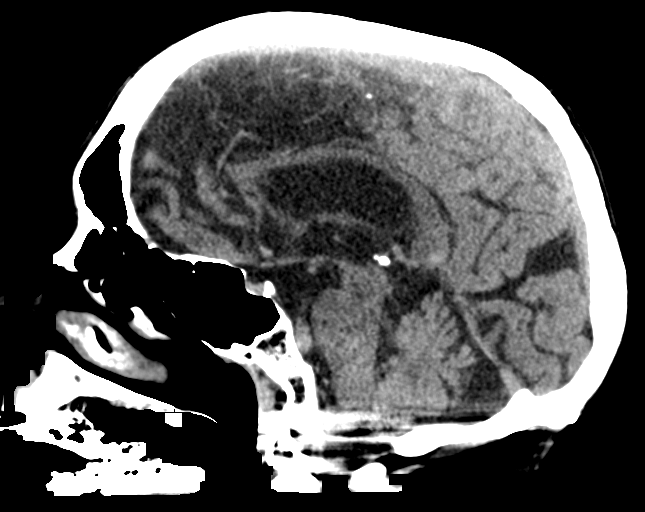
[im 37/55  brain]
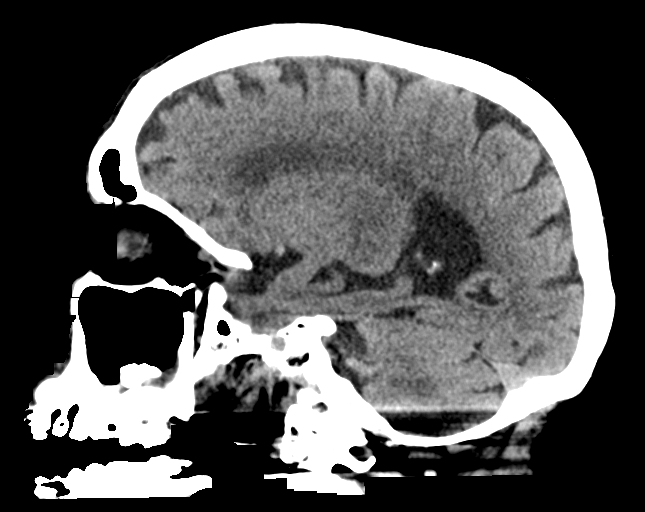

[15 of 47 positions shown; findings below may reference images not displayed]

FINDINGS: Cortical atrophy is identified and not notably changed. No evidence
of acute intracranial abnormality including hemorrhage, infarct,
mass lesion, mass effect, midline shift or abnormal extra-axial
fluid collection. No hydrocephalus or pneumocephalus. The calvarium
is intact. Imaged paranasal sinuses and mastoid air cells are clear.
IMPRESSION: No acute abnormality.

Cortical atrophy is unchanged in appearance.

## 2016-10-29 ENCOUNTER — Ambulatory Visit: Payer: Medicare Other | Admitting: Neurology
